# Patient Record
Sex: Male | Born: 1955 | Race: White | Hispanic: No | Marital: Married | Smoking: Former smoker
Health system: Southern US, Community
[De-identification: ages and names within clinical notes are randomized; demographics above are authoritative.]

## PROBLEM LIST (undated history)

## (undated) DIAGNOSIS — G459 Transient cerebral ischemic attack, unspecified: Secondary | ICD-10-CM

## (undated) DIAGNOSIS — E876 Hypokalemia: Secondary | ICD-10-CM

## (undated) DIAGNOSIS — F419 Anxiety disorder, unspecified: Secondary | ICD-10-CM

## (undated) DIAGNOSIS — R918 Other nonspecific abnormal finding of lung field: Secondary | ICD-10-CM

## (undated) DIAGNOSIS — I10 Essential (primary) hypertension: Secondary | ICD-10-CM

## (undated) DIAGNOSIS — K219 Gastro-esophageal reflux disease without esophagitis: Secondary | ICD-10-CM

## (undated) DIAGNOSIS — E7800 Pure hypercholesterolemia, unspecified: Secondary | ICD-10-CM

## (undated) HISTORY — DX: Essential (primary) hypertension: I10

## (undated) HISTORY — DX: Transient cerebral ischemic attack, unspecified: G45.9

## (undated) HISTORY — DX: Other nonspecific abnormal finding of lung field: R91.8

## (undated) HISTORY — PX: NO PAST SURGERIES: SHX2092

## (undated) HISTORY — DX: Hypokalemia: E87.6

## (undated) HISTORY — DX: Anxiety disorder, unspecified: F41.9

---

## 2009-12-04 ENCOUNTER — Encounter: Payer: Self-pay | Admitting: Cardiovascular Disease

## 2009-12-04 ENCOUNTER — Encounter: Payer: Self-pay | Admitting: Gastroenterology

## 2009-12-12 ENCOUNTER — Encounter: Payer: Self-pay | Admitting: Cardiovascular Disease

## 2010-01-01 ENCOUNTER — Ambulatory Visit: Payer: Self-pay | Admitting: Gastroenterology

## 2010-01-07 DIAGNOSIS — F411 Generalized anxiety disorder: Secondary | ICD-10-CM | POA: Insufficient documentation

## 2010-01-07 DIAGNOSIS — E785 Hyperlipidemia, unspecified: Secondary | ICD-10-CM

## 2010-01-07 DIAGNOSIS — I10 Essential (primary) hypertension: Secondary | ICD-10-CM | POA: Insufficient documentation

## 2010-01-07 DIAGNOSIS — R51 Headache: Secondary | ICD-10-CM | POA: Insufficient documentation

## 2010-01-07 DIAGNOSIS — R519 Headache, unspecified: Secondary | ICD-10-CM | POA: Insufficient documentation

## 2010-01-08 ENCOUNTER — Encounter: Payer: Self-pay | Admitting: Cardiovascular Disease

## 2010-01-09 ENCOUNTER — Ambulatory Visit: Payer: Self-pay | Admitting: Cardiovascular Disease

## 2010-01-09 DIAGNOSIS — R0602 Shortness of breath: Secondary | ICD-10-CM

## 2011-01-22 NOTE — Letter (Signed)
Summary: [facility] Office Note  [facility] Office Note   Imported By: Roderic Ovens 01/21/2010 11:12:37  _____________________________________________________________________  External Attachment:    Type:   Image     Comment:   External Document

## 2011-01-22 NOTE — Assessment & Plan Note (Signed)
Summary: np3/htn/jml   Referring Provider:  Edd Fabian, MD  Primary Provider:  Lelan Pons, [facility]    History of Present Illness: Marvin Bender is seen today at the request of [facility].  He has significant anxiety disorder and is seeing psych.  This has been going on for about 2 years.  He has reactive HTN and is on a calcium blocker and ACE.  He describes a 40lb weight gain over the last year.  He quit smoking about 2 years ago.  He has dyspnea that seems functional.  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  There is no history of cardiac problems.  There is no history of ETOH abuts or chronic liver or kidney disease.  I reviewed lab work from [facility] and chemistries, LFTS and testosterone were normal.  I dd not see a thyroid.  His LDL was 136.  I told [name] I did not think he had a significant heart problem.  He can stop his amlodipine as it can cause weight gain and his BP is not that bad.  He will double his lisinipril in leiu of this.  He does not need any cardiac testing.    Current Problems (verified): 1)  Hyperlipidemia  (ICD-272.4) 2)  Hypertension  (ICD-401.9) 3)  Anxiety  (ICD-300.00) 4)  Screening Colorectal-cancer  (ICD-V76.51) 5)  Headache, Chronic  (ICD-784.0)  Current Medications (verified): 1)  Lisinopril 10 Mg Tabs (Lisinopril) .... One Tablet By Mouth Once Daily 2)  Clonazepam 0.5 Mg Tabs (Clonazepam) .... One Tablet By Mouth Once Daily  Allergies (verified): No Known Drug Allergies  Past History:  Past Medical History: Last updated: 01/07/2010 Current Problems:  HYPERLIPIDEMIA (ICD-272.4) HYPERTENSION (ICD-401.9) ANXIETY (ICD-300.00) SCREENING COLORECTAL-CANCER (ICD-V76.51) HEADACHE, CHRONIC (ICD-784.0)  Past Surgical History: Last updated: 01/01/2010 Unremarkable  Review of Systems       Denies fever, malais, weight loss, blurry vision, decreased visual acuity, cough, sputum, hemoptysis, pleuritic  pain, palpitaitons, heartburn, abdominal pain, melena, lower extremity edema, claudication, or rash.   Vital Signs:  Patient profile:   55 year old male Height:      73 inches Weight:      263 pounds BMI:     34.82 Pulse rate:   80 / minute BP sitting:   128 / 84  (left arm) Cuff size:   large  Vitals Entered By: Hardin Negus, RMA (January 09, 2010 9:22 AM)  Physical Exam  General:  Affect appropriate Healthy:  appears stated age HEENT: normal Neck supple with no adenopathy JVP normal no bruits no thyromegaly Lungs clear with no wheezing and good diaphragmatic motion Heart:  S1/S2 no murmur,rub, gallop or click PMI normal Abdomen: benighn, BS positve, no tenderness, no AAA no bruit.  No HSM or HJR Distal pulses intact with no bruits No edema Neuro non-focal Skin warm and dry    Impression & Recommendations:  Problem # 1:  HYPERLIPIDEMIA (ICD-272.4) Consider diet Rx.  No vascular disease.  Target LDL less than 130  Problem # 2:  HYPERTENSION (ICD-401.9) Stop amlodipine and increase ACE.  Low sodium diet.  Consder beta blocker to blunt somatic effects of anxiety if BP stays elevated on max dose of Lisinopril in future (40mg ) The following medications were removed from the medication list:    Amlodipine Besylate 5 Mg Tabs (Amlodipine besylate) ..... One tablet by mouth once daily His updated medication list for this problem includes:    Lisinopril 10 Mg Tabs (Lisinopril) .Marland KitchenMarland KitchenMarland KitchenMarland Kitchen  One tablet by mouth once daily  Problem # 3:  DYSPNEA (ICD-786.05) Functional secondary to weight gain and anxiety.  Normal cardiac exam and ECG The following medications were removed from the medication list:    Amlodipine Besylate 5 Mg Tabs (Amlodipine besylate) ..... One tablet by mouth once daily His updated medication list for this problem includes:    Lisinopril 10 Mg Tabs (Lisinopril) ..... One tablet by mouth once daily  Patient Instructions: 1)  Your physician has recommended you  make the following change in your medication:  2)  Stop Amlodipine 3)  Take Lisinopril 10mg  one whole tablet daily 4)  Your physician recommends that you schedule a follow-up appointment as needed with Dr Charlton Haws     EKG Report  Procedure date:  01/09/2010

## 2011-01-22 NOTE — Assessment & Plan Note (Signed)
Summary: abd swelling..em   History of Present Illness Visit Type: consult  Primary GI MD: Elie Goody MD [facility] Primary Provider: Lelan Pons, [facility]  Requesting Provider: Lelan Pons, [facility]  Chief Complaint: Bloating, weight gain, and hemorrhoids  History of Present Illness:   This is a 55 year old white male here today with his wife. He relates a weight gain of 28 pounds over the past 12 months, which occurred about 12 months after he stopped cigarette smoking. He notes a bloating feeling in his abdomen, but does not report any significant gas, flatus or belching. His wife was diagnosed with colon cancer at age 69. Patient has not previously had colonoscopy. He relates hemorrhoidal swelling in the past, but has not recently had problems with his hemorrhoids. He denies rectal bleeding and rectal pain.   GI Review of Systems    Reports bloating and  weight gain.      Denies abdominal pain, acid reflux, belching, chest pain, dysphagia with liquids, dysphagia with solids, heartburn, loss of appetite, nausea, vomiting, vomiting blood, and  weight loss.      Reports hemorrhoids.     Denies anal fissure, black tarry stools, change in bowel habit, constipation, diarrhea, diverticulosis, fecal incontinence, heme positive stool, irritable bowel syndrome, jaundice, light color stool, liver problems, rectal bleeding, and  rectal pain.   Current Medications (verified): 1)  Amlodipine Besylate 5 Mg Tabs (Amlodipine Besylate) .... One Tablet By Mouth Once Daily 2)  Lisinopril 10 Mg Tabs (Lisinopril) .... One Tablet By Mouth Once Daily 3)  Clonazepam 0.5 Mg Tabs (Clonazepam) .... 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  Smoking Status:  quit  Review of Systems       The patient complains of allergy/sinus, anxiety-new, back pain, fatigue, headaches-new, and hearing problems.         The pertinent positives and negatives are noted as above and in the HPI. All other ROS were reviewed and were negative.   Vital Signs:  Patient profile:   54 year old male Height:      73 inches Weight:      265 pounds BMI:     35.09 BSA:     2.43 Pulse rate:   76 / minute Pulse rhythm:   regular BP sitting:   132 / 86  (left arm) Cuff size:   regular  Vitals Entered By: Ok Anis CMA (January 01, 2010 3:29 PM)  Physical Exam  General:  Well developed, well nourished, no acute distress. obese.   Head:  Normocephalic and atraumatic. Eyes:  PERRLA, no icterus. Ears:  Normal auditory acuity. Mouth:  No deformity or lesions, dentition normal. Neck:  Supple; no masses or thyromegaly. Lungs:  Clear throughout to auscultation. Heart:  Regular rate and rhythm; no murmurs, rubs,  or bruits. Abdomen:  Soft, nontender and nondistended. No masses, hepatosplenomegaly or hernias noted. Normal bowel sounds. Rectal:  deferred until time of colonoscopy.   Msk:  Symmetrical with no gross deformities. Normal posture. Pulses:  Normal pulses noted. Extremities:  No clubbing,  cyanosis, edema or deformities noted. Neurologic:  Alert and  oriented x4;  grossly normal neurologically. Cervical Nodes:  No significant cervical adenopathy. Inguinal Nodes:  No significant inguinal adenopathy. Psych:  Alert and cooperative. Normal mood and affect.  Impression & Recommendations:  Problem # 1:  SCREENING COLORECTAL-CANCER (ICD-V76.51) Average risk for colorectal cancer. His wife has had colon cancer and has undergone 3  colonoscopies so he understands the procedure and the reasons for it. He is reluctant to schedule but after some discussion he agreed to discuss it further with his wife. I attempted to answer all of his questions and address his concerns. I supplied written literature regarding colonoscopy and colon cancer screening. He states he will call back to schedule. The risks, benefits and alternatives to colonoscopy with possible biopsy and possible polypectomy were discussed with the patient and they consent to proceed. The procedure will be scheduled electively. His abdominal bloating is likely related to weight gain.  Patient Instructions: 1)  Please call us back and schedule a colonoscopy and your convenience.  2)  Colorectal Cancer Screening handout given.  3)  Copy sent to : Lelan Pons, PAC 4)  The medication list was reviewed and reconciled.  All changed / newly prescribed medications were explained.  A complete medication list was provided to the patient / caregiver.

## 2011-01-22 NOTE — Progress Notes (Signed)
Summary: [facility] Med List  [facility] Med List   Imported By: Roderic Ovens 01/21/2010 11:12:08  _____________________________________________________________________  External Attachment:    Type:   Image     Comment:   External Document

## 2011-01-23 NOTE — Letter (Signed)
Summary: [facility]  [employer]   Imported By: Sherian Rein 01/06/2010 10:10:47  _____________________________________________________________________  External Attachment:    Type:   Image     Comment:   External Document

## 2012-03-03 ENCOUNTER — Encounter: Payer: Self-pay | Admitting: Gastroenterology

## 2012-04-11 ENCOUNTER — Ambulatory Visit (AMBULATORY_SURGERY_CENTER): Payer: PRIVATE HEALTH INSURANCE | Admitting: *Deleted

## 2012-04-11 ENCOUNTER — Encounter: Payer: Self-pay | Admitting: Gastroenterology

## 2012-04-11 VITALS — Ht 73.0 in | Wt 265.9 lb

## 2012-04-11 DIAGNOSIS — Z1211 Encounter for screening for malignant neoplasm of colon: Secondary | ICD-10-CM

## 2012-04-11 MED ORDER — PEG-KCL-NACL-NASULF-NA ASC-C 100 G PO SOLR: ORAL | Status: DC

## 2012-04-11 NOTE — Progress Notes (Signed)
Pt did not brings medication to [facility].  Pt called to give current meds medications.  Instructed not to take Triamterene/HCTZ day of procedure. Marvin Bender

## 2012-04-19 ENCOUNTER — Encounter: Payer: Self-pay | Admitting: Gastroenterology

## 2012-05-18 ENCOUNTER — Encounter: Payer: Self-pay | Admitting: Gastroenterology

## 2012-05-18 ENCOUNTER — Ambulatory Visit (AMBULATORY_SURGERY_CENTER): Payer: PRIVATE HEALTH INSURANCE | Admitting: Gastroenterology

## 2012-05-18 VITALS — BP 131/105 | HR 87 | Temp 97.9°F | Resp 20 | Ht 73.0 in | Wt 265.0 lb

## 2012-05-18 DIAGNOSIS — Z1211 Encounter for screening for malignant neoplasm of colon: Secondary | ICD-10-CM

## 2012-05-18 MED ORDER — SODIUM CHLORIDE 0.9 % IV SOLN: 500.0000 mL | INTRAVENOUS | Status: DC

## 2012-05-18 NOTE — Patient Instructions (Signed)
YOU HAD AN ENDOSCOPIC PROCEDURE TODAY AT THE [facility]: Refer to the procedure report that was given to you for any specific questions about what was found during the examination.  If the procedure report does not answer your questions, please call your gastroenterologist to clarify.  If you requested that your care partner not be given the details of your procedure findings, then the procedure report has been included in a sealed envelope for you to review at your convenience later.  YOU SHOULD EXPECT: Some feelings of bloating in the abdomen. Passage of more gas than usual.  Walking can help get rid of the air that was put into your GI tract during the procedure and reduce the bloating. If you had a lower endoscopy (such as a colonoscopy or flexible sigmoidoscopy) you may notice spotting of blood in your stool or on the toilet paper. If you underwent a bowel prep for your procedure, then you may not have a normal bowel movement for a few days.  DIET: Your first meal following the procedure should be a light meal and then it is ok to progress to your normal diet.  A half-sandwich or bowl of soup is an example of a good first meal.  Heavy or fried foods are harder to digest and may make you feel nauseous or bloated.  Likewise meals heavy in dairy and vegetables can cause extra gas to form and this can also increase the bloating.  Drink plenty of fluids but you should avoid alcoholic beverages for 24 hours.  ACTIVITY: Your care partner should take you home directly after the procedure.  You should plan to take it easy, moving slowly for the rest of the day.  You can resume normal activity the day after the procedure however you should NOT DRIVE or use heavy machinery for 24 hours (because of the sedation medicines used during the test).    SYMPTOMS TO REPORT IMMEDIATELY: A gastroenterologist can be reached at any hour.  During normal business hours, 8:30 AM to 5:00 PM Monday through Friday,  call [phone].  After hours and on weekends, please call the GI answering service at [phone] who will take a message and have the physician on call contact you.   Following lower endoscopy (colonoscopy or flexible sigmoidoscopy):  Excessive amounts of blood in the stool  Significant tenderness or worsening of abdominal pains  Swelling of the abdomen that is new, acute  Fever of 100F or higher    FOLLOW UP: If any biopsies were taken you will be contacted by phone or by letter within the next 1-3 weeks.  Call your gastroenterologist if you have not heard about the biopsies in 3 weeks.  Our staff will call the home number listed on your records the next business day following your procedure to check on you and address any questions or concerns that you may have at that time regarding the information given to you following your procedure. This is a courtesy call and so if there is no answer at the home number and we have not heard from you through the emergency physician on call, we will assume that you have returned to your regular daily activities without incident.  SIGNATURES/CONFIDENTIALITY: You and/or your care partner have signed paperwork which will be entered into your electronic medical record.  These signatures attest to the fact that that the information above on your After Visit Summary has been reviewed and is understood.  Full responsibility of the confidentiality   of this discharge information lies with you and/or your care-partner.     

## 2012-05-18 NOTE — Op Note (Signed)
[facility] [address] [place], [place]  [address]  COLONOSCOPY PROCEDURE REPORT  PATIENT:  Marvin Bender, Marvin Bender  MR#:  [id number] BIRTHDATE:  1956-01-27, 55 yrs. old  GENDER:  male ENDOSCOPIST:  Judie Petit T. Russella Dar, MD, [facility]  PROCEDURE DATE:  05/18/2012 PROCEDURE:  Colonoscopy [address]9629 ASA CLASS:  Class II INDICATIONS:  1) Routine Risk Screening MEDICATIONS:   MAC sedation, administered by CRNA, propofol (Diprivan) 200 mg IV DESCRIPTION OF PROCEDURE:   After the risks benefits and alternatives of the procedure were thoroughly explained, informed consent was obtained.  Digital rectal exam was performed and revealed no abnormalities.   The LB CF-H180AL [id number] endoscope was introduced through the anus and advanced to the cecum, which was identified by both the appendix and ileocecal valve, without limitations.  The quality of the prep was excellent, using MoviPrep.  The instrument was then slowly withdrawn as the colon was fully examined. <<PROCEDUREIMAGES>> FINDINGS:  A normal appearing cecum, ileocecal valve, and appendiceal orifice were identified. The ascending, hepatic flexure, transverse, splenic flexure, descending, sigmoid colon, and rectum appeared unremarkable.   Retroflexed views in the rectum revealed no abnormalities.   The time to cecum =  1 minutes. The scope was then withdrawn (time =  8.25  min) from the patient and the procedure completed.  COMPLICATIONS:  None  ENDOSCOPIC IMPRESSION: 1) Normal colon  RECOMMENDATIONS: 1) Continue current colorectal screening recommendations for "routine risk" patients with a repeat colonoscopy in 10 years.  Venita Lick. Russella Dar, MD, [facility]  n. eSIGNED:   Venita Lick. Antionio Negron at 05/18/2012 09:07 AM  Lupa, Harvie Heck, [phone]

## 2012-05-18 NOTE — Progress Notes (Signed)
Patient did not have preoperative order for IV antibiotic SSI prophylaxis. (G8918)  Patient did not experience any of the following events: a burn prior to discharge; a fall within the facility; wrong site/side/patient/procedure/implant event; or a hospital transfer or hospital admission upon discharge from the facility. (G8907)  

## 2012-05-19 ENCOUNTER — Telehealth: Payer: Self-pay | Admitting: *Deleted

## 2012-05-19 NOTE — Telephone Encounter (Signed)
  Follow up Call-  Call back number 05/18/2012  Post procedure Call Back phone  # [phone]  Permission to leave phone message Yes     Patient questions:  Do you have a fever, pain , or abdominal swelling? no Pain Score  0 *  Have you tolerated food without any problems? yes  Have you been able to return to your normal activities? yes  Do you have any questions about your discharge instructions: Diet   no Medications  no Follow up visit  no  Do you have questions or concerns about your Care? no  Actions: * If pain score is 4 or above: No action needed, pain <4.  Information provided by wife.

## 2012-05-20 ENCOUNTER — Encounter: Payer: Self-pay | Admitting: Gastroenterology

## 2013-09-15 LAB — PULMONARY FUNCTION TEST

## 2013-09-28 ENCOUNTER — Ambulatory Visit (INDEPENDENT_AMBULATORY_CARE_PROVIDER_SITE_OTHER): Payer: Managed Care, Other (non HMO) | Admitting: Internal Medicine

## 2013-09-28 ENCOUNTER — Encounter: Payer: Self-pay | Admitting: Internal Medicine

## 2013-09-28 VITALS — BP 138/92 | HR 82 | Temp 97.9°F | Ht 73.0 in | Wt 274.8 lb

## 2013-09-28 DIAGNOSIS — R918 Other nonspecific abnormal finding of lung field: Secondary | ICD-10-CM

## 2013-09-28 DIAGNOSIS — R0602 Shortness of breath: Secondary | ICD-10-CM

## 2013-09-28 NOTE — Patient Instructions (Addendum)
GERD (REFLUX)  is an extremely common cause of respiratory symptoms, many times with no significant heartburn at all.    It can be treated with medication, but also with lifestyle changes including avoidance of late meals, excessive alcohol, smoking cessation, and avoid fatty foods, chocolate, peppermint, colas, red wine, and acidic juices such as orange juice.  NO MINT OR MENTHOL PRODUCTS SO NO COUGH DROPS  USE SUGARLESS CANDY INSTEAD (jolley ranchers or Stover's)  NO OIL BASED VITAMINS - use powdered substitutes.   Try prilosec 20mg   Take 30-60 min before first meal of the day and Pepcid 20 mg one bedtime until return  Please schedule a follow up office visit in 4 weeks, sooner if needed with previous lung function tests from [facility]

## 2013-09-28 NOTE — Progress Notes (Signed)
  Subjective:    Patient ID: Marvin Bender, male    DOB: 11/01/1956    MRN: [id number]  HPI  89 yowm quit smoking 2008 with no problems at that point and wt 225 with onset of doe x 2012  eval with CT MPN self referred to pulmonary clinic 09/28/2013 for second opinion after intial w/ at [place].  09/28/2013 1st [facility] office visit/ Caton Popowski cc indolent onset doe progressive to point where oob x 100 ft x 2 years with neg cardiac w/u then.  No assoc cough. No prev response to inhalers  No obvious day to day or daytime variabilty or assoc  or cp or chest tightness, subjective wheeze overt sinus  symptoms. No unusual exp hx or h/o childhood pna/ asthma or knowledge of premature birth.  Sleeping ok without nocturnal  or early am exacerbation  of respiratory  c/o's or need for noct saba. Also denies any obvious fluctuation of symptoms with weather or environmental changes or other aggravating or alleviating factors except as outlined above   Current Medications, Allergies, Complete Past Medical History, Past Surgical History, Family History, and Social History were reviewed in [employer] record.  ROS  The following are not active complaints unless bolded sore throat, dysphagia, dental problems, itching, sneezing,  nasal congestion or excess/ purulent secretions, ear ache,   fever, chills, sweats, unintended wt loss, pleuritic or exertional cp, hemoptysis,  orthopnea pnd or leg swelling, presyncope, palpitations, heartburn, abdominal pain, anorexia, nausea, vomiting, diarrhea  or change in bowel or urinary habits, change in stools or urine, dysuria,hematuria,  rash, arthralgias, visual complaints, headache, numbness weakness or ataxia or problems with walking or coordination,  change in mood/affect or memory.       Review of Systems  Constitutional: Negative for fever, chills, activity change, appetite change and unexpected weight change.  HENT: Negative for congestion,  dental problem, postnasal drip, rhinorrhea, sneezing, sore throat, trouble swallowing and voice change.   Eyes: Negative for visual disturbance.  Respiratory: Positive for shortness of breath. Negative for cough and choking.   Cardiovascular: Negative for chest pain and leg swelling.  Gastrointestinal: Negative for nausea, vomiting and abdominal pain.  Genitourinary: Negative for difficulty urinating.       Heartburn Indigestion  Musculoskeletal: Negative for arthralgias.  Skin: Negative for rash.  Psychiatric/Behavioral: Negative for behavioral problems and confusion.       Objective:   Physical Exam  Wt Readings from Last 3 Encounters:  09/28/13 214 lb (97.07 kg)  05/18/12 265 lb (120.203 kg)  04/11/12 265 lb 14.4 oz (120.611 kg)      HEENT: nl dentition, turbinates, and orophanx. Nl external ear canals without cough reflex   NECK :  without JVD/Nodes/TM/ nl carotid upstrokes bilaterally   LUNGS: no acc muscle use, clear to A and P bilaterally without cough on insp or exp maneuvers   CV:  RRR  no s3 or murmur or increase in P2, no edema   ABD:  Obese / soft and nontender with nl excursion in the supine position. No bruits or organomegaly, bowel sounds nl  MS:  warm without deformities, calf tenderness, cyanosis or clubbing  SKIN: warm and dry without lesions    NEURO:  alert, approp, no deficits    CT Chest 09/15/13  bilat lung nodules up to 7.5 mm on R          Assessment & Plan:

## 2013-09-30 ENCOUNTER — Encounter: Payer: Self-pay | Admitting: Internal Medicine

## 2013-09-30 DIAGNOSIS — R918 Other nonspecific abnormal finding of lung field: Secondary | ICD-10-CM | POA: Insufficient documentation

## 2013-09-30 NOTE — Assessment & Plan Note (Signed)
-   09/28/2013  Walked RA x 3 laps @ 185 ft each stopped due to end of study sats still 88%   When respiratory symptoms begin or become refractory well after a patient reports complete smoking cessation,  Especially when this wasn't the case while they were smoking, a red flag is raised based on the work of Dr Primitivo Gauze which states:  if you quit smoking when your best day FEV1 is still well preserved it is highly unlikely you will progress to severe disease.  That is to say, once the smoking stops,  the symptoms should not suddenly erupt or markedly worsen.  If so, the differential diagnosis should include  obesity/deconditioning,  LPR/Reflux/Aspiration syndromes,  occult CHF, or  especially side effect of medications commonly used in this population.   In this case obesity and assoc GERD/lpr the likely cause of his symptoms > rec gerd rx then regroup with pfts from [facility] when available.

## 2013-09-30 NOTE — Assessment & Plan Note (Signed)
[employer] guidelines reviewed, agree with limited ct in 6 months for just the 7.5 mm nodule - the others can be followed in one year with full Chest ct if the larger one is stable p 6 months.

## 2013-10-26 ENCOUNTER — Encounter: Payer: Self-pay | Admitting: Internal Medicine

## 2013-10-26 ENCOUNTER — Ambulatory Visit (INDEPENDENT_AMBULATORY_CARE_PROVIDER_SITE_OTHER): Payer: Managed Care, Other (non HMO) | Admitting: Internal Medicine

## 2013-10-26 VITALS — BP 116/80 | HR 80 | Temp 98.0°F | Ht 73.0 in | Wt 276.0 lb

## 2013-10-26 DIAGNOSIS — R0602 Shortness of breath: Secondary | ICD-10-CM

## 2013-10-26 NOTE — Assessment & Plan Note (Addendum)
-   09/28/2013  Walked RA x 3 laps @ 185 ft each stopped due to end of study sats still 88%  Doubt this level of desat correlates with his ex limitation so reasonable to do cpst on 02 if needed to sort out mechanism of sob.  The sense of sob that occurs bending over is clearly more related to obesity

## 2013-10-26 NOTE — Patient Instructions (Addendum)
Please see patient coordinator before you leave today  to schedule CPST with before and after spirometry   Don't stop the prilosec or pepcid until after the CPST

## 2013-10-26 NOTE — Progress Notes (Signed)
Subjective:    Patient ID: Marvin Bender, male    DOB: November 17, 1956    MRN: [id number]   Brief patient profile:  28 yowm quit smoking 2008 with no problems at that point and wt 225 with onset of doe x 2012  eval with CT MPN self [facility] to [facility] 09/28/2013 for second opinion after intial w/ at [place].   History of Present Illness  09/28/2013 1st [facility] office visit/ Marvin [facility] cc indolent onset doe progressive to point where oob x 100 ft x 2 years with neg cardiac w/u then.  No assoc cough. No prev response to inhalers. rec GERD (REFLUX)  is an extremely common cause of respiratory symptoms, many times with no significant heartburn at all.  Try prilosec 20mg   Take 30-60 min before first meal of the day and Pepcid 20 mg one bedtime until return Please schedule a follow up office visit in 4 weeks, sooner if needed with previous lung function tests from [facility]  10/26/2013 f/u ov/Marvin [facility]ender re:  Unexplained sob x 2 years/ did not bring records though has them at home Chief Complaint  Patient presents with  . Follow-up    Pt states that his breathing is unchanged since his last visit.  doe bending over picking up leaves or tying shoes more than walking   No obvious day to day or daytime variabilty or assoc  or cp or chest tightness, subjective wheeze overt sinus  symptoms. No unusual exp hx or h/o childhood pna/ asthma or knowledge of premature birth.  Sleeping ok without nocturnal  or early am exacerbation  of respiratory  c/o's or need for noct saba. Also denies any obvious fluctuation of symptoms with weather or environmental changes or other aggravating or alleviating factors except as outlined above   Current Medications, Allergies, Complete Past Medical History, Past Surgical History, Family History, and Social History were reviewed in [employer] record.  ROS  The following are not active complaints unless bolded sore throat, dysphagia, dental problems,  itching, sneezing,  nasal congestion or excess/ purulent secretions, ear ache,   fever, chills, sweats, unintended wt loss, pleuritic or exertional cp, hemoptysis,  orthopnea pnd or leg swelling, presyncope, palpitations, heartburn, abdominal pain, anorexia, nausea, vomiting, diarrhea  or change in bowel or urinary habits, change in stools or urine, dysuria,hematuria,  rash, arthralgias, visual complaints, headache, numbness weakness or ataxia or problems with walking or coordination,  change in mood/affect or memory.            Objective:   Physical Exam  10/26/2013       276  Wt Readings from Last 3 Encounters:  09/28/13 214 lb (97.07 kg)  05/18/12 265 lb (120.203 kg)  04/11/12 265 lb 14.4 oz (120.611 kg)      HEENT: nl dentition, turbinates, and orophanx. Nl external ear canals without cough reflex   NECK :  without JVD/Nodes/TM/ nl carotid upstrokes bilaterally   LUNGS: no acc muscle use, clear to A and P bilaterally without cough on insp or exp maneuvers   CV:  RRR  no s3 or murmur or increase in P2, no edema   ABD:  Obese / soft and nontender with nl excursion in the supine position. No bruits or organomegaly, bowel sounds nl  MS:  warm without deformities, calf tenderness, cyanosis or clubbing  SKIN: warm and dry without lesions    NEURO:  alert, approp, no deficits     CT Chest 09/15/13 non contrasted bilat  lung nodules up to 7.5 mm on R          Assessment & Plan:

## 2013-10-27 ENCOUNTER — Encounter: Payer: Self-pay | Admitting: Internal Medicine

## 2013-10-28 ENCOUNTER — Encounter: Payer: Self-pay | Admitting: Internal Medicine

## 2013-11-01 ENCOUNTER — Encounter (HOSPITAL_COMMUNITY): Payer: Managed Care, Other (non HMO) | Attending: Internal Medicine

## 2013-11-01 DIAGNOSIS — R0602 Shortness of breath: Secondary | ICD-10-CM | POA: Insufficient documentation

## 2013-11-01 DIAGNOSIS — R079 Chest pain, unspecified: Secondary | ICD-10-CM | POA: Insufficient documentation

## 2013-11-01 DIAGNOSIS — Z01811 Encounter for preprocedural respiratory examination: Secondary | ICD-10-CM | POA: Insufficient documentation

## 2013-11-08 ENCOUNTER — Telehealth: Payer: Self-pay | Admitting: Internal Medicine

## 2013-11-08 NOTE — Telephone Encounter (Signed)
Pt had CPST 11/01/13. Prelim is in EPIC. Please advise Dr. Sherene Sires thanks

## 2013-11-09 ENCOUNTER — Encounter: Payer: Self-pay | Admitting: Internal Medicine

## 2013-11-09 NOTE — Telephone Encounter (Signed)
Study showed nl aerobic function/ capacity - would be happy to review in detail in the context of an ov to regroup re best way to regain normal ex tolerance but there is no physiologic limitation identified

## 2013-11-09 NOTE — Telephone Encounter (Signed)
Pt aware of recs.  Nothing further needed. 

## 2013-11-12 DIAGNOSIS — R0602 Shortness of breath: Secondary | ICD-10-CM

## 2013-11-13 ENCOUNTER — Telehealth: Payer: Self-pay | Admitting: Internal Medicine

## 2013-11-13 NOTE — Progress Notes (Signed)
Quick Note:  LMTCB ______ 

## 2013-11-13 NOTE — Telephone Encounter (Signed)
Call patient : Study is unremarkable, no change in recs - needs ov to regroup if still having problems to review study in detail and come up with longterm plan      Spoke with pt and notified of results per Dr. Sherene Sires. Pt verbalized understanding.  Pt states that he is doing well and does not feel like f/u is needed He is asking if he needs to continue on acid suppression meds If so, if aciphex okay for him to take? Or continue prilosec? Please advise, thanks

## 2013-11-13 NOTE — Telephone Encounter (Signed)
Ok to continue if he feels they are helping until next ov  If not convinced helping then d/c aciphex = prilosec - his choice  Note Due back here anyway 02/2014 re nodules so we can regroup then re ppi then

## 2013-11-13 NOTE — Progress Notes (Signed)
Quick Note:  Spoke with pt and notified of results per Dr. Wert. Pt verbalized understanding and denied any questions.  ______ 

## 2013-11-13 NOTE — Telephone Encounter (Signed)
LMTCB

## 2013-11-14 ENCOUNTER — Telehealth: Payer: Self-pay | Admitting: Internal Medicine

## 2013-11-14 NOTE — Telephone Encounter (Signed)
Pt advised. Sonita Michiels, CMA  

## 2013-11-14 NOTE — Telephone Encounter (Signed)
Called and spoke with spouse. He was not in. Advised me to call cell. I called cell LMTCB x1

## 2013-11-15 MED ORDER — RABEPRAZOLE SODIUM 20 MG PO TBEC: 20.0000 mg | DELAYED_RELEASE_TABLET | Freq: Every day | ORAL | Status: DC

## 2013-11-15 NOTE — Telephone Encounter (Signed)
Per last phone note: Ok to continue if he feels they are helping until next ov  If not convinced helping then d/c  aciphex = prilosec - his choice  Pt is asking for an rx for aciphex be sent to [facility] in [place]. Rx sent. Carron Curie, CMA

## 2014-01-20 ENCOUNTER — Ambulatory Visit (INDEPENDENT_AMBULATORY_CARE_PROVIDER_SITE_OTHER): Payer: Managed Care, Other (non HMO) | Admitting: Nurse Practitioner

## 2014-01-20 ENCOUNTER — Encounter: Payer: Self-pay | Admitting: Nurse Practitioner

## 2014-01-20 VITALS — BP 112/85 | HR 100 | Temp 97.4°F | Ht 73.0 in | Wt 272.0 lb

## 2014-01-20 DIAGNOSIS — L239 Allergic contact dermatitis, unspecified cause: Secondary | ICD-10-CM

## 2014-01-20 DIAGNOSIS — L259 Unspecified contact dermatitis, unspecified cause: Secondary | ICD-10-CM

## 2014-01-20 MED ORDER — METHYLPREDNISOLONE ACETATE 80 MG/ML IJ SUSP
80.0000 mg | Freq: Once | INTRAMUSCULAR | Status: AC
  Administered 2014-01-20: 80 mg via INTRAMUSCULAR

## 2014-01-20 NOTE — Progress Notes (Signed)
   Subjective:    Patient ID: Pamella Pertandy A Pulis, male    DOB: 1956/07/14, 58 y.o.   MRN: [id number]  HPI  Patient in c/o rash - woke up with it this morning- appears to bbe on bil forearms and wrist- is spreading slowly- Very itchy- denies exposure to any new foods ,lotions or soaps. 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    Review of Systems  Constitutional: Negative.   HENT: Negative.   Respiratory: Negative.   Cardiovascular: Negative.   Musculoskeletal: Negative.   Neurological: Negative.   All other systems reviewed and are negative.       Objective:   Physical Exam  Constitutional: He appears well-developed and well-nourished.  Cardiovascular: Normal rate, regular rhythm and normal heart sounds.   Pulmonary/Chest: Effort normal and breath sounds normal.  Skin: Skin is warm. Rash noted.  Fine erythematous maculo-papular rash on bil flank and forearms.  Psychiatric: He has a normal mood and affect. His behavior is normal. Judgment and thought content normal.   BP 112/85  Pulse 100  Temp(Src) 97.4 F (36.3 C) (Oral)  Ht 6\' 1"  (1.854 m)  Wt 272 lb (123.378 kg)  BMI 35.89 kg/m2        Assessment & Plan:   1. Allergic dermatitis    Meds ordered this encounter  Medications  . rosuvastatin (CRESTOR) 10 MG tablet    Sig: Take 10 mg by mouth daily.  . methylPREDNISolone acetate (DEPO-MEDROL) injection 80 mg    Sig:    Avoid scratching Cool compresses Benadryl OTC for itching RTO prn  Mary-Margaret Daphine DeutscherMartin, FNP

## 2014-01-20 NOTE — Patient Instructions (Signed)
Contact Dermatitis Contact dermatitis is a rash that happens when something touches the skin. You touched something that irritates your skin, or you have allergies to something you touched. HOME CARE   Avoid the thing that caused your rash.  Keep your rash away from hot water, soap, sunlight, chemicals, and other things that might bother it.  Do not scratch your rash.  You can take cool baths to help stop itching.  Only take medicine as told by your doctor.  Keep all doctor visits as told. GET HELP RIGHT AWAY IF:   Your rash is not better after 3 days.  Your rash gets worse.  Your rash is puffy (swollen), tender, red, sore, or warm.  You have problems with your medicine. MAKE SURE YOU:   Understand these instructions.  Will watch your condition.  Will get help right away if you are not doing well or get worse. Document Released: 10/04/2009 Document Revised: 02/29/2012 Document Reviewed: 05/12/2011 ExitCare Patient Information 2014 [employer].  

## 2014-03-15 ENCOUNTER — Telehealth: Payer: Self-pay | Admitting: *Deleted

## 2014-03-15 DIAGNOSIS — R918 Other nonspecific abnormal finding of lung field: Secondary | ICD-10-CM

## 2014-03-15 NOTE — Telephone Encounter (Signed)
Message copied by Christen ButterASKIN, Kaiana Marion M on Thu Mar 15, 2014 12:37 PM ------      Message from: Nyoka CowdenWERT, MICHAEL B      Created: Sat Sep 30, 2013  8:38 AM       [facility] limited [facility] re 7.5 mm R nodule ------

## 2014-03-15 NOTE — Telephone Encounter (Signed)
LMTCB for the pt 

## 2014-03-19 NOTE — Telephone Encounter (Signed)
LMTCB

## 2014-03-20 NOTE — Telephone Encounter (Signed)
Spoke with pt and notified of ct chest needed  He verbalized understanding  [facility]

## 2014-03-30 ENCOUNTER — Ambulatory Visit (INDEPENDENT_AMBULATORY_CARE_PROVIDER_SITE_OTHER)
Admission: RE | Admit: 2014-03-30 | Discharge: 2014-03-30 | Disposition: A | Discharge status: Home / Self Care | Payer: Managed Care, Other (non HMO) | Source: Ambulatory Visit | Attending: Internal Medicine | Admitting: Internal Medicine

## 2014-03-30 DIAGNOSIS — R918 Other nonspecific abnormal finding of lung field: Secondary | ICD-10-CM

## 2014-03-31 ENCOUNTER — Encounter: Payer: Self-pay | Admitting: Internal Medicine

## 2014-04-02 ENCOUNTER — Telehealth: Payer: Self-pay | Admitting: Internal Medicine

## 2014-04-02 NOTE — Progress Notes (Signed)
Quick Note:  Spoke with pt and notified of results per Dr. Wert. Pt verbalized understanding and denied any questions.  ______ 

## 2014-04-02 NOTE — Telephone Encounter (Signed)
Pt is aware of CT results. Marvin Bender had already called him about this and reviewed his results. Nothing further was needed.

## 2014-04-11 ENCOUNTER — Ambulatory Visit (INDEPENDENT_AMBULATORY_CARE_PROVIDER_SITE_OTHER): Payer: Managed Care, Other (non HMO)

## 2014-04-11 ENCOUNTER — Ambulatory Visit (INDEPENDENT_AMBULATORY_CARE_PROVIDER_SITE_OTHER): Payer: Managed Care, Other (non HMO) | Admitting: General Practice

## 2014-04-11 ENCOUNTER — Encounter: Payer: Self-pay | Admitting: General Practice

## 2014-04-11 ENCOUNTER — Telehealth: Payer: Self-pay | Admitting: Nurse Practitioner

## 2014-04-11 VITALS — BP 106/85 | HR 96 | Temp 96.8°F | Ht 73.0 in | Wt 277.6 lb

## 2014-04-11 DIAGNOSIS — K3189 Other diseases of stomach and duodenum: Secondary | ICD-10-CM

## 2014-04-11 DIAGNOSIS — R1013 Epigastric pain: Secondary | ICD-10-CM

## 2014-04-11 DIAGNOSIS — R109 Unspecified abdominal pain: Secondary | ICD-10-CM

## 2014-04-11 LAB — POCT UA - MICROSCOPIC ONLY
Casts, Ur, LPF, POC: NEGATIVE
Crystals, Ur, HPF, POC: NEGATIVE
MUCUS UA: NEGATIVE
YEAST UA: NEGATIVE

## 2014-04-11 LAB — POCT URINALYSIS DIPSTICK
BILIRUBIN UA: NEGATIVE
Glucose, UA: NEGATIVE
KETONES UA: NEGATIVE
LEUKOCYTES UA: NEGATIVE
Nitrite, UA: NEGATIVE
SPEC GRAV UA: 1.02
Urobilinogen, UA: NEGATIVE
pH, UA: 6

## 2014-04-11 LAB — POCT CBC
GRANULOCYTE PERCENT: 63.7 % (ref 37–80)
HEMATOCRIT: 48 % (ref 43.5–53.7)
HEMOGLOBIN: 15.5 g/dL (ref 14.1–18.1)
Lymph, poc: 2.9 (ref 0.6–3.4)
MCH, POC: 27.4 pg (ref 27–31.2)
MCHC: 32.3 g/dL (ref 31.8–35.4)
MCV: 84.7 fL (ref 80–97)
MPV: 6.3 fL (ref 0–99.8)
POC GRANULOCYTE: 6.4 (ref 2–6.9)
POC LYMPH PERCENT: 29 %L (ref 10–50)
Platelet Count, POC: 389 10*3/uL (ref 142–424)
RBC: 5.7 M/uL (ref 4.69–6.13)
RDW, POC: 13.6 %
WBC: 10 10*3/uL (ref 4.6–10.2)

## 2014-04-11 NOTE — Telephone Encounter (Signed)
No N/V/D. Appt scheduled. Patient aware.

## 2014-04-11 NOTE — Progress Notes (Deleted)
   Subjective:    Patient ID: Marvin Bender, male    DOB: 12/20/1956, 58 y.o.   MRN: [id number]  HPI     Review of Systems     Objective:   Physical Exam        Assessment & Plan:

## 2014-04-11 NOTE — Progress Notes (Signed)
Patient ID: Pamella Pertandy A Montoro, male   DOB: 01-24-1956, 58 y.o.   MRN: [id number]  Patient left exam room without being seen by a provider. Patient was called with results of test. He denies further abdominal pain and will follow up if symptoms return.   Results for orders placed in visit on 04/11/14  POCT CBC      Result Value Ref Range   WBC 10.0  4.6 - 10.2 K/uL   Lymph, poc 2.9  0.6 - 3.4   POC LYMPH PERCENT 29.0  10 - 50 %L   POC Granulocyte 6.4  2 - 6.9   Granulocyte percent 63.7  37 - 80 %G   RBC 5.7  4.69 - 6.13 M/uL   Hemoglobin 15.5  14.1 - 18.1 g/dL   HCT, POC 95.648.0  21.343.5 - 53.7 %   MCV 84.7  80 - 97 fL   MCH, POC 27.4  27 - 31.2 pg   MCHC 32.3  31.8 - 35.4 g/dL   RDW, POC 08.613.6     Platelet Count, POC 389.0  142 - 424 K/uL   MPV 6.3  0 - 99.8 fL  POCT URINALYSIS DIPSTICK      Result Value Ref Range   Color, UA gold     Clarity, UA clear     Glucose, UA negative     Bilirubin, UA negative     Ketones, UA negative     Spec Grav, UA 1.020     Blood, UA trace     pH, UA 6.0     Protein, UA trace     Urobilinogen, UA negative     Nitrite, UA negative     Leukocytes, UA Negative    POCT UA - MICROSCOPIC ONLY      Result Value Ref Range   WBC, Ur, HPF, POC rare     RBC, urine, microscopic occ     Bacteria, U Microscopic occ     Mucus, UA negative     Epithelial cells, urine per micros occ     Crystals, Ur, HPF, POC negative     Casts, Ur, LPF, POC negative     Yeast, UA negative     [facility] reading (PRIMARY) by Coralie KeensMae E. Sansa Alkema, FNP-C, no acute finding on abdominal xray.

## 2014-06-05 ENCOUNTER — Other Ambulatory Visit: Payer: Self-pay | Admitting: Internal Medicine

## 2014-07-04 ENCOUNTER — Other Ambulatory Visit: Payer: Self-pay | Admitting: Internal Medicine

## 2014-09-14 ENCOUNTER — Other Ambulatory Visit: Payer: Self-pay | Admitting: Internal Medicine

## 2014-09-14 MED ORDER — RABEPRAZOLE SODIUM 20 MG PO TBEC: DELAYED_RELEASE_TABLET | ORAL | Status: DC

## 2014-10-11 ENCOUNTER — Other Ambulatory Visit: Payer: Self-pay | Admitting: Internal Medicine

## 2014-11-08 ENCOUNTER — Other Ambulatory Visit: Payer: Self-pay | Admitting: Internal Medicine

## 2014-12-27 ENCOUNTER — Other Ambulatory Visit: Payer: Self-pay | Admitting: Internal Medicine

## 2015-01-24 ENCOUNTER — Other Ambulatory Visit: Payer: Self-pay | Admitting: Internal Medicine

## 2015-01-28 ENCOUNTER — Ambulatory Visit (INDEPENDENT_AMBULATORY_CARE_PROVIDER_SITE_OTHER): Payer: PRIVATE HEALTH INSURANCE

## 2015-01-28 ENCOUNTER — Encounter (INDEPENDENT_AMBULATORY_CARE_PROVIDER_SITE_OTHER): Payer: Self-pay

## 2015-01-28 ENCOUNTER — Ambulatory Visit (INDEPENDENT_AMBULATORY_CARE_PROVIDER_SITE_OTHER): Payer: PRIVATE HEALTH INSURANCE | Admitting: Family Medicine

## 2015-01-28 VITALS — BP 138/101 | HR 119 | Temp 98.6°F | Ht 73.0 in | Wt 275.5 lb

## 2015-01-28 DIAGNOSIS — M25512 Pain in left shoulder: Secondary | ICD-10-CM

## 2015-01-28 MED ORDER — HYDROCODONE-ACETAMINOPHEN 10-325 MG PO TABS: 1.0000 | ORAL_TABLET | Freq: Four times a day (QID) | ORAL | Status: DC | PRN

## 2015-01-28 MED ORDER — NAPROXEN 500 MG PO TABS: 500.0000 mg | ORAL_TABLET | Freq: Two times a day (BID) | ORAL | Status: DC

## 2015-01-28 NOTE — Progress Notes (Signed)
   Subjective:    Patient ID: Marvin Bender, male    DOB: 21-May-1956, 59 y.o.   MRN: [id number]  HPI Patient was lifting up his grandson when he heard a pop and developed severe left shoulder pain.  He is having difficulty raising his left arm above his head and is having severe shoulder pain.  Review of Systems  Constitutional: Negative for fever.  HENT: Negative for ear pain.   Eyes: Negative for discharge.  Respiratory: Negative for cough.   Cardiovascular: Negative for chest pain.  Gastrointestinal: Negative for abdominal distention.  Endocrine: Negative for polyuria.  Genitourinary: Negative for difficulty urinating.  Musculoskeletal: Positive for myalgias and arthralgias. Negative for gait problem and neck pain.  Skin: Negative for color change and rash.  Neurological: Negative for speech difficulty and headaches.  Psychiatric/Behavioral: Negative for agitation.       Objective:    BP 138/101 mmHg  Pulse 119  Temp(Src) 98.6 F (37 C) (Oral)  Ht 6\' 1"  (1.854 m)  Wt 275 lb 8 oz (124.966 kg)  BMI 36.36 kg/m2 Physical Exam  TTP left deltoid and decrease ROM with internal and external rotation.      Assessment & Plan:     ICD-9-CM ICD-10-CM   1. Left shoulder pain 719.41 M25.512 HYDROcodone-acetaminophen (NORCO) 10-325 MG per tablet     naproxen (NAPROSYN) 500 MG tablet     DG Shoulder Left     No Follow-up on file.  Deatra CanterWilliam J Oxford FNP

## 2015-02-20 ENCOUNTER — Other Ambulatory Visit: Payer: Self-pay | Admitting: Internal Medicine

## 2015-03-18 ENCOUNTER — Other Ambulatory Visit: Payer: Self-pay | Admitting: Radiology

## 2015-03-29 NOTE — Patient Instructions (Addendum)
Marvin Bender  03/29/2015   Your procedure is scheduled on:   04/04/2015  Report to [facility] at  615  AM.  Call this number if you have problems the morning of surgery: [phone]   Remember:   Do not eat food or drink liquids after midnight.   Take these medicines the morning of surgery with A SIP OF WATER: losartan, zantac, dyazide. Ativan if needed   Do not wear jewelry, make-up or nail polish.  Do not wear lotions, powders, or perfumes.   Do not shave 48 hours prior to surgery. Men may shave face and neck.  Do not bring valuables to the hospital.  [facility] is not responsible for any belongings or valuables.               Contacts, dentures or bridgework may not be worn into surgery.  Leave suitcase in the car. After surgery it may be brought to your room.  For patients admitted to the hospital, discharge time is determined by your treatment team.               Patients discharged the day of surgery will not be allowed to drive home.  Name and phone number of your driver: family  Special Instructions: Shower using CHG 2 nights before surgery and the night before surgery.  If you shower the day of surgery use CHG.  Use special wash - you have one bottle of CHG for all showers.  You should use approximately 1/3 of the bottle for each shower.   Please read over the following fact sheets that you were given: Pain Booklet, Coughing and Deep Breathing, Surgical Site Infection Prevention, Anesthesia Post-op Instructions and Care and Recovery After Surgery Meniscus Injury, Arthroscopy Arthroscopy is a surgical procedure that involves the use of a small scope that has a camera and surgical instruments on the end (arthroscope). An arthroscope can be used to repair your meniscus injury.  LET [facility] CARE PROVIDER KNOW ABOUT:  Any allergies you have.  All medicines you are taking, including vitamins, herbs, eyedrops, creams, and over-the-counter medicines.  Any recent  colds or infections you have had or currently have.  Previous problems you or members of your family have had with the use of anesthetics.  Any blood disorders or blood clotting problems you have.  Previous surgeries you have had.  Medical conditions you have. RISKS AND COMPLICATIONS Generally, this is a safe procedure. However, as with any procedure, problems can occur. Possible problems include:  Damage to nerves or blood vessels.  Excess bleeding.  Blood clots.  Infection. BEFORE THE PROCEDURE  Do not eat or drink for 6-8 hours before the procedure.  Take medicines as directed by your surgeon. Ask your surgeon about changing or stopping your regular medicines.  You may have lab tests the morning of surgery. PROCEDURE  You will be given one of the following:   A medicine that numbs the area (local anesthesia).  A medicine that makes you go to sleep (general anesthesia).  A medicine injected into your spine that numbs your body below the waist (spinal anesthesia). Most often, several small cuts (incisions) are made in the knee. The arthroscope and instruments go into the incisions to repair the damage. The torn portion of the meniscus is removed.  During this time, your surgeon may find a partial or complete tear in a cruciate ligament, such as the anterior cruciate ligament (ACL). A completely  torn cruciate ligament is reconstructed by taking tissue from another part of the body (grafting) and placing it into the injured area. This requires several larger incisions to complete the repair. Sometimes, open surgery is needed for collateral ligament injuries. If a collateral ligament is found to be injured, your surgeon may staple or suture the tear through a slightly larger incision on the side of the knee. AFTER THE PROCEDURE You will be taken to the recovery area where your progress will be monitored. When you are awake, stable, and taking fluids without complications, you will  be allowed to go home. This is usually the same day. However, more extensive repairs of a ligament may require an overnight stay.  The recovery time after repairing your meniscus or ligament depends on the amount of damage to these structures. It also depends on whether or not reconstructive knee surgery was needed.   A torn or stretched ligament (ligament sprain) may take 6-8 weeks to heal. It takes about the same amount of time if your surgeon removed a torn meniscus.  A repaired meniscus may require 6-12 weeks of recovery time.  A torn ligament needing reconstructive surgery may take 6-12 months to heal fully. Document Released: 12/04/2000 Document Revised: 12/12/2013 Document Reviewed: 05/05/2013 [facility] Patient Information 2015 [place], [place]. This information is not intended to replace advice given to you by your health care provider. Make sure you discuss any questions you have with your health care provider. Arthroscopic Procedure, Knee An arthroscopic procedure can find what is wrong with your knee. PROCEDURE Arthroscopy is a surgical technique that allows your orthopedic surgeon to diagnose and treat your knee injury with accuracy. They will look into your knee through a small instrument. This is almost like a small (pencil sized) telescope. Because arthroscopy affects your knee less than open knee surgery, you can anticipate a more rapid recovery. Taking an active role by following your caregiver's instructions will help with rapid and complete recovery. Use crutches, rest, elevation, ice, and knee exercises as instructed. The length of recovery depends on various factors including type of injury, age, physical condition, medical conditions, and your rehabilitation. Your knee is the joint between the large bones (femur and tibia) in your leg. Cartilage covers these bone ends which are smooth and slippery and allow your knee to bend and move smoothly. Two menisci, thick, semi-lunar shaped  pads of cartilage which form a rim inside the joint, help absorb shock and stabilize your knee. Ligaments bind the bones together and support your knee joint. Muscles move the joint, help support your knee, and take stress off the joint itself. Because of this all programs and physical therapy to rehabilitate an injured or repaired knee require rebuilding and strengthening your muscles. AFTER THE PROCEDURE  After the procedure, you will be moved to a recovery area until most of the effects of the medication have worn off. Your caregiver will discuss the test results with you.  Only take over-the-counter or prescription medicines for pain, discomfort, or fever as directed by your caregiver. SEEK MEDICAL CARE IF:   You have increased bleeding from your wounds.  You see redness, swelling, or have increasing pain in your wounds.  You have pus coming from your wound.  You have an oral temperature above 102 F (38.9 C).  You notice a bad smell coming from the wound or dressing.  You have severe pain with any motion of your knee. SEEK IMMEDIATE MEDICAL CARE IF:   You develop a rash.  You have difficulty breathing.  You have any allergic problems. Document Released: 12/04/2000 Document Revised: 02/29/2012 Document Reviewed: 06/27/2008 [facility] Patient Information 2015 [place]. This information is not intended to replace advice given to you by your health care provider. Make sure you discuss any questions you have with your health care provider. PATIENT INSTRUCTIONS POST-ANESTHESIA  IMMEDIATELY FOLLOWING SURGERY:  Do not drive or operate machinery for the first twenty four hours after surgery.  Do not make any important decisions for twenty four hours after surgery or while taking narcotic pain medications or sedatives.  If you develop intractable nausea and vomiting or a severe headache please notify your doctor immediately.  FOLLOW-UP:  Please make an appointment with your surgeon  as instructed. You do not need to follow up with anesthesia unless specifically instructed to do so.  WOUND CARE INSTRUCTIONS (if applicable):  Keep a dry clean dressing on the anesthesia/puncture wound site if there is drainage.  Once the wound has quit draining you may leave it open to air.  Generally you should leave the bandage intact for twenty four hours unless there is drainage.  If the epidural site drains for more than 36-48 hours please call the anesthesia department.  QUESTIONS?:  Please feel free to call your physician or the hospital operator if you have any questions, and they will be happy to assist you.

## 2015-04-01 ENCOUNTER — Encounter (HOSPITAL_COMMUNITY)
Admission: RE | Admit: 2015-04-01 | Discharge: 2015-04-01 | Disposition: A | Discharge status: Home / Self Care | Payer: PRIVATE HEALTH INSURANCE | Source: Ambulatory Visit | Attending: Orthopaedic Surgery | Admitting: Orthopaedic Surgery

## 2015-04-01 ENCOUNTER — Other Ambulatory Visit: Payer: Self-pay

## 2015-04-01 ENCOUNTER — Encounter (HOSPITAL_COMMUNITY): Payer: Self-pay

## 2015-04-01 DIAGNOSIS — Z01810 Encounter for preprocedural cardiovascular examination: Secondary | ICD-10-CM | POA: Insufficient documentation

## 2015-04-01 DIAGNOSIS — Z01812 Encounter for preprocedural laboratory examination: Secondary | ICD-10-CM | POA: Diagnosis not present

## 2015-04-01 HISTORY — DX: Gastro-esophageal reflux disease without esophagitis: K21.9

## 2015-04-01 LAB — COMPREHENSIVE METABOLIC PANEL
ALBUMIN: 4.1 g/dL (ref 3.5–5.2)
ALK PHOS: 98 U/L (ref 39–117)
ALT: 30 U/L (ref 0–53)
AST: 28 U/L (ref 0–37)
Anion gap: 12 (ref 5–15)
BILIRUBIN TOTAL: 1 mg/dL (ref 0.3–1.2)
BUN: 19 mg/dL (ref 6–23)
CHLORIDE: 101 mmol/L (ref 96–112)
CO2: 23 mmol/L (ref 19–32)
Calcium: 9.5 mg/dL (ref 8.4–10.5)
Creatinine, Ser: 1.33 mg/dL (ref 0.50–1.35)
GFR calc non Af Amer: 57 mL/min — ABNORMAL LOW (ref >90)
GFR, EST AFRICAN AMERICAN: 66 mL/min — AB (ref >90)
GLUCOSE: 100 mg/dL — AB (ref 70–99)
POTASSIUM: 3.7 mmol/L (ref 3.5–5.1)
Sodium: 136 mmol/L (ref 135–145)
Total Protein: 7.6 g/dL (ref 6.0–8.3)

## 2015-04-01 LAB — CBC WITH DIFFERENTIAL/PLATELET
Basophils Absolute: 0.1 10*3/uL (ref 0.0–0.1)
Basophils Relative: 1 % (ref 0–1)
EOS ABS: 0.3 10*3/uL (ref 0.0–0.7)
EOS PCT: 3 % (ref 0–5)
HCT: 45.9 % (ref 39.0–52.0)
Hemoglobin: 16.1 g/dL (ref 13.0–17.0)
LYMPHS ABS: 3 10*3/uL (ref 0.7–4.0)
Lymphocytes Relative: 27 % (ref 12–46)
MCH: 29.4 pg (ref 26.0–34.0)
MCHC: 35.1 g/dL (ref 30.0–36.0)
MCV: 83.9 fL (ref 78.0–100.0)
Monocytes Absolute: 1.5 10*3/uL — ABNORMAL HIGH (ref 0.1–1.0)
Monocytes Relative: 13 % — ABNORMAL HIGH (ref 3–12)
NEUTROS PCT: 56 % (ref 43–77)
Neutro Abs: 6.2 10*3/uL (ref 1.7–7.7)
Platelets: 363 10*3/uL (ref 150–400)
RBC: 5.47 MIL/uL (ref 4.22–5.81)
RDW: 12.7 % (ref 11.5–15.5)
WBC: 11.2 10*3/uL — ABNORMAL HIGH (ref 4.0–10.5)

## 2015-04-01 LAB — URINALYSIS, ROUTINE W REFLEX MICROSCOPIC
Bilirubin Urine: NEGATIVE
Glucose, UA: NEGATIVE mg/dL
HGB URINE DIPSTICK: NEGATIVE
KETONES UR: NEGATIVE mg/dL
Leukocytes, UA: NEGATIVE
Nitrite: NEGATIVE
PROTEIN: NEGATIVE mg/dL
Specific Gravity, Urine: 1.02 (ref 1.005–1.030)
Urobilinogen, UA: 0.2 mg/dL (ref 0.0–1.0)
pH: 6 (ref 5.0–8.0)

## 2015-04-01 MED ORDER — CHLORHEXIDINE GLUCONATE 4 % EX LIQD: 60.0000 mL | Freq: Once | CUTANEOUS | Status: DC

## 2015-04-01 NOTE — Pre-Procedure Instructions (Signed)
Patient in for PAT. Denies c/o SOB, palpitations or other complaints of at this  Time. Hr 118, sat is 97%, BP 136/90. EKG shows bigemeny. Has no prior history. Called PCP and obtained  EKG done 3 years ago. There is no bigemeny on that EKG. Showed EKG to Dr Jayme CloudGonzalez and due to new onset and no treatment, patient will be cancelled for now until sees cardiology Dr Hilda LiasKeeling called and agrees with this treatment plan. Cardiology called and appointment made for April 21 at 1100. Called and spoke with patient and wife who both verbalize understanding of this. Called OR scheduler to cancel case as well.

## 2015-04-04 ENCOUNTER — Encounter (HOSPITAL_COMMUNITY): Admission: RE | Payer: Self-pay | Source: Ambulatory Visit

## 2015-04-04 ENCOUNTER — Ambulatory Visit (HOSPITAL_COMMUNITY)
Admission: RE | Admit: 2015-04-04 | Payer: PRIVATE HEALTH INSURANCE | Source: Ambulatory Visit | Admitting: Orthopaedic Surgery

## 2015-04-04 SURGERY — ARTHROSCOPY, KNEE, WITH MEDIAL MENISCECTOMY
Anesthesia: General | Laterality: Left

## 2015-04-08 ENCOUNTER — Ambulatory Visit: Payer: Managed Care, Other (non HMO) | Admitting: Physical Therapy

## 2015-04-10 NOTE — Progress Notes (Signed)
Cardiology Office Note   Date:  04/10/2015   ID:  Marvin PertRandy A Deskins, DOB Feb 02, 1956, MRN [id number]  PCP:  Lelan PonsPASQUALE, GINA, PA  Cardiologist:   Charlton HawsPeter Tobi Groesbeck, MD   No chief complaint on file.     History of Present Illness: Marvin Bender is a 59 y.o. male who presents for surgical clearance due to abnormal ECG  Injured left shoulder a couple of months ago Seen 5 years ago with HTN and anxiety with 40 lb weight gain.  At that time was on calcium blocker and ACE for HTN Also on statin for elevated lipids  Had cardiopulmonary stress test ordered by Dr Sherene SiresWert in 2014 that was negative for circulatory limitation and most consistent with deconditioning  CT with pulmonary nodules no ILD  Last seen by Dr Sherene SiresWert for dyspnea in 2014 thought obesity was major issue  CT 03/30/14 no nodules seen mild interstitial changes I reviewed this scan and no coronary calcification was present   Some exertional dyspnea.  No cough or sputum quit smoking years ago.  Needs left knee arthroscopic surgery  He has high HR and PACls at rest does not note palpitations or syncope.  Denies excess ETOH  Has always been "high strung"    Past Medical History  Diagnosis Date  . Hypertension   . Anxiety   . GERD (gastroesophageal reflux disease)     Past Surgical History  Procedure Laterality Date  . No past surgeries       Current Outpatient Prescriptions  Medication Sig Dispense Refill  . HYDROcodone-acetaminophen (NORCO) 10-325 MG per tablet Take 1 tablet by mouth every 6 (six) hours as needed (tooth pain). (Patient not taking: Reported on 03/25/2015) 30 tablet 0  . LORazepam (ATIVAN) 0.5 MG tablet Take 0.5 mg by mouth every 8 (eight) hours as needed for anxiety.    Marland Kitchen. losartan (COZAAR) 100 MG tablet Take 100 mg by mouth daily.    . naproxen (NAPROSYN) 500 MG tablet Take 1 tablet (500 mg total) by mouth 2 (two) times daily with a meal. (Patient not taking: Reported on 03/25/2015) 30 tablet 0  . nortriptyline (PAMELOR) 25  MG capsule Take 25 mg by mouth at bedtime.    . ranitidine (ZANTAC) 150 MG tablet Take 150 mg by mouth daily.    . rosuvastatin (CRESTOR) 10 MG tablet Take 10 mg by mouth daily.    Marland Kitchen. triamterene-hydrochlorothiazide (DYAZIDE) 37.5-25 MG per capsule Take 1 capsule by mouth daily.     No current facility-administered medications for this visit.    Allergies:   Review of patient's allergies indicates no known allergies.    Social History:  The patient  reports that he quit smoking about 8 years ago. His smoking use included Cigarettes. He has a 40 pack-year smoking history. He has never used smokeless tobacco. He reports that he drinks alcohol. He reports that he does not use illicit drugs.   Family History:  The patient's family history includes Asthma in his mother; Heart disease in his mother and sister. There is no history of Colon cancer or Stomach cancer.    ROS:  Please see the history of present illness.   Otherwise, review of systems are positive for none.   All other systems are reviewed and negative.    PHYSICAL EXAM: VS:  There were no vitals taken for this visit. , BMI There is no weight on file to calculate BMI. Affect appropriate Healthy:  appears stated age HEENT: normal Neck  supple with no adenopathy JVP normal no bruits no thyromegaly Lungs clear with no wheezing and good diaphragmatic motion Heart:  S1/S2 no murmur, no rub, gallop or click PMI normal Abdomen: benighn, BS positve, no tenderness, no AAA no bruit.  No HSM or HJR Distal pulses intact with no bruits No edema Neuro non-focal Skin warm and dry No muscular weakness    EKG:  4/11  Sinus tachycardia rate 115 with coupled aberrant PAC;s    Recent Labs: 04/01/2015: ALT 30; BUN 19; Creatinine 1.33; Hemoglobin 16.1; Platelets 363; Potassium 3.7; Sodium 136    Lipid Panel No results found for: CHOL, TRIG, HDL, CHOLHDL, VLDL, LDLCALC, LDLDIRECT    Wt Readings from Last 3 Encounters:  01/28/15 275 lb 8  oz (124.966 kg)  04/11/14 277 lb 9.6 oz (125.919 kg)  01/20/14 272 lb (123.378 kg)      Other studies Reviewed: Additional studies/ records that were reviewed today include: Primary care records and Epic notes.    ASSESSMENT AND PLAN:  1.  Preop:  Concern for obesity ongoing dyspnea and arrhythmia.  F/U lexiscan myovue can't walk due to knee issues.  Echo to assess RV/LV function 2. PAC;s  Ambient high HR  Stop diuretic start beta blocker  3. COPD  No active wheezing f/u Wert   Current medicines are reviewed at length with the patient today.  The patient does not have concerns regarding medicines.  The following changes have been made:  Stop diuretic start Toprol  Labs/ tests ordered today include:  Echo and Lexiscan Myovue  No orders of the defined types were placed in this encounter.     Disposition:   FU with [place] office after tests     Signed, Charlton Haws, MD  04/10/2015 9:51 PM    [facility] [address] [place], [place], [place]  [address] Phone: [phone]; Fax: [phone]

## 2015-04-11 ENCOUNTER — Encounter: Payer: Self-pay | Admitting: *Deleted

## 2015-04-11 ENCOUNTER — Encounter: Payer: Self-pay | Admitting: Cardiovascular Disease

## 2015-04-11 ENCOUNTER — Ambulatory Visit (INDEPENDENT_AMBULATORY_CARE_PROVIDER_SITE_OTHER): Payer: PRIVATE HEALTH INSURANCE | Admitting: Cardiovascular Disease

## 2015-04-11 VITALS — BP 125/70 | HR 84 | Ht 73.0 in | Wt 265.4 lb

## 2015-04-11 DIAGNOSIS — R0602 Shortness of breath: Secondary | ICD-10-CM | POA: Diagnosis not present

## 2015-04-11 DIAGNOSIS — R9431 Abnormal electrocardiogram [ECG] [EKG]: Secondary | ICD-10-CM

## 2015-04-11 MED ORDER — METOPROLOL SUCCINATE ER 50 MG PO TB24: 50.0000 mg | ORAL_TABLET | Freq: Every day | ORAL | Status: DC

## 2015-04-11 NOTE — Patient Instructions (Signed)
Your physician recommends that you schedule a follow-up appointment in: After Stress test and ECHO  Your physician has requested that you have a lexiscan myoview. For further information please visit [email or web address]. Please follow instruction sheet, as given.  Your physician has requested that you have an echocardiogram. Echocardiography is a painless test that uses sound waves to create images of your heart. It provides your doctor with information about the size and shape of your heart and how well your heart's chambers and valves are working. This procedure takes approximately one hour. There are no restrictions for this procedure.  Start Toprol 50 mg one Tablet Daily  Stop Taking Dyazide  Thank you for choosing  [facility]!

## 2015-04-16 ENCOUNTER — Telehealth: Payer: Self-pay | Admitting: Cardiovascular Disease

## 2015-04-16 NOTE — Telephone Encounter (Signed)
Patient would like to speak with nurse regarding BP readings post medication change / tg

## 2015-04-17 ENCOUNTER — Encounter (HOSPITAL_COMMUNITY)
Admission: RE | Admit: 2015-04-17 | Discharge: 2015-04-17 | Disposition: A | Discharge status: Home / Self Care | Payer: PRIVATE HEALTH INSURANCE | Source: Ambulatory Visit | Attending: Cardiovascular Disease | Admitting: Cardiovascular Disease

## 2015-04-17 ENCOUNTER — Ambulatory Visit (HOSPITAL_COMMUNITY)
Admission: RE | Admit: 2015-04-17 | Discharge: 2015-04-17 | Disposition: A | Discharge status: Home / Self Care | Payer: PRIVATE HEALTH INSURANCE | Source: Ambulatory Visit | Attending: Cardiovascular Disease | Admitting: Cardiovascular Disease

## 2015-04-17 ENCOUNTER — Encounter (HOSPITAL_COMMUNITY): Payer: Self-pay

## 2015-04-17 DIAGNOSIS — I10 Essential (primary) hypertension: Secondary | ICD-10-CM | POA: Insufficient documentation

## 2015-04-17 DIAGNOSIS — Z87891 Personal history of nicotine dependence: Secondary | ICD-10-CM | POA: Diagnosis not present

## 2015-04-17 DIAGNOSIS — R0600 Dyspnea, unspecified: Secondary | ICD-10-CM | POA: Diagnosis not present

## 2015-04-17 DIAGNOSIS — R0602 Shortness of breath: Secondary | ICD-10-CM

## 2015-04-17 DIAGNOSIS — Z01810 Encounter for preprocedural cardiovascular examination: Secondary | ICD-10-CM | POA: Diagnosis not present

## 2015-04-17 DIAGNOSIS — E785 Hyperlipidemia, unspecified: Secondary | ICD-10-CM | POA: Insufficient documentation

## 2015-04-17 DIAGNOSIS — Z01818 Encounter for other preprocedural examination: Secondary | ICD-10-CM | POA: Insufficient documentation

## 2015-04-17 DIAGNOSIS — R9431 Abnormal electrocardiogram [ECG] [EKG]: Secondary | ICD-10-CM | POA: Insufficient documentation

## 2015-04-17 MED ORDER — SODIUM CHLORIDE 0.9 % IJ SOLN
10.0000 mL | INTRAMUSCULAR | Status: DC | PRN
  Administered 2015-04-17: 10 mL via INTRAVENOUS
  Filled 2015-04-17: qty 10

## 2015-04-17 MED ORDER — TECHNETIUM TC 99M SESTAMIBI - CARDIOLITE
30.0000 | Freq: Once | INTRAVENOUS | Status: AC | PRN
  Administered 2015-04-17: 30 via INTRAVENOUS

## 2015-04-17 MED ORDER — SODIUM CHLORIDE 0.9 % IJ SOLN
INTRAMUSCULAR | Status: AC
  Administered 2015-04-17: 10 mL via INTRAVENOUS
  Filled 2015-04-17: qty 3

## 2015-04-17 MED ORDER — REGADENOSON 0.4 MG/5ML IV SOLN
INTRAVENOUS | Status: AC
  Administered 2015-04-17: 0.4 mg via INTRAVENOUS
  Filled 2015-04-17: qty 5

## 2015-04-17 MED ORDER — REGADENOSON 0.4 MG/5ML IV SOLN
0.4000 mg | Freq: Once | INTRAVENOUS | Status: AC | PRN
  Administered 2015-04-17: 0.4 mg via INTRAVENOUS

## 2015-04-17 MED ORDER — TECHNETIUM TC 99M SESTAMIBI GENERIC - CARDIOLITE
10.0000 | Freq: Once | INTRAVENOUS | Status: AC | PRN
  Administered 2015-04-17: 10 via INTRAVENOUS

## 2015-04-17 NOTE — Progress Notes (Signed)
*  PRELIMINARY RESULTS* Echocardiogram 2D Echocardiogram has been performed.  Jeryl ColumbiaLLIOTT, Keyon Winnick 04/17/2015, 9:13 AM

## 2015-04-17 NOTE — Progress Notes (Signed)
Stress Lab Nurses Notes - [facility]  Emori A Stinger 04/17/2015 Reason for doing test: Surgical Clearance and abnormal ECG Type of test: Marlane HatcherLexiscan Cardiolite Nurse performing test: Parke PoissonPhyllis Billingsly, RN Nuclear Medicine Tech: Marcella DubsMiranda Womack Echo Tech: Not Applicable MD performing test: Branch/ M.Geni BersLenze PA Family MD: Lelan PonsGina Pasquale PA Test explained and consent signed: Yes.   IV started: Saline lock flushed, No redness or edema and Saline lock started in radiology Symptoms: Discomfort in throat Treatment/Intervention: None Reason test stopped: protocol completed After recovery IV was: Discontinued via X-ray tech and No redness or edema Patient to return to Nuc. Med at : 11:45 Patient discharged: Home Patient's Condition upon discharge was: stable Comments: During test BP 135/91 & HR 92.  Recovery BP 143/94 & HR 76. Symptoms resolved in recovery. Low Sodium diet instruction given, understands verbally. Erskine SpeedBillingsley, Yulisa Chirico T

## 2015-04-18 ENCOUNTER — Telehealth: Payer: Self-pay | Admitting: Cardiovascular Disease

## 2015-04-18 NOTE — Telephone Encounter (Signed)
Needs clearance sent to Dr.Keelings office / tg

## 2015-04-19 NOTE — Telephone Encounter (Signed)
Ok to have surgery Myovue low risk EF normal by echo  Make sure he started beta blocker

## 2015-04-19 NOTE — Telephone Encounter (Signed)
Will forward to Dr.Nishan. 

## 2015-04-19 NOTE — Telephone Encounter (Signed)
Faxed to Dr.Keeling, pt is on beta blocker

## 2015-04-24 ENCOUNTER — Other Ambulatory Visit: Payer: Self-pay | Admitting: Radiology

## 2015-04-24 NOTE — Patient Instructions (Signed)
Marvin Bender  04/24/2015   Your procedure is scheduled on:  04/30/2015  [facility] at 6:15 AM.  Call this number if you have problems the morning of surgery: [phone]   Remember:   Do not eat food or drink liquids after midnight.   Take these medicines the morning of surgery with A SIP OF WATER: Ativan, Cozaar, Metoprolol, Zantac   Do not wear jewelry, make-up or nail polish.  Do not wear lotions, powders, or perfumes. You may wear deodorant.  Do not shave 48 hours prior to surgery. Men may shave face and neck.  Do not bring valuables to the hospital.  [facility] responsible                  for any belongings or valuables.               Contacts, dentures or bridgework may not be worn into surgery.  Leave suitcase in the car. After surgery it may be brought to your room.  For patients admitted to the hospital, discharge time is determined by your                treatment team.               Patients discharged the day of surgery will not be allowed to drive  home.  Name and phone number of your driver: family  Special Instructions: Shower using CHG 2 nights before surgery and the night before surgery.  If you shower the day of surgery use CHG.  Use special wash - you have one bottle of CHG for all showers.  You should use approximately 1/3 of the bottle for each shower.   Please read over the following fact sheets that you were given: Care and Recovery After Surgery   Arthroscopic Procedure, Knee An arthroscopic procedure can find what is wrong with your knee. PROCEDURE Arthroscopy is a surgical technique that allows your orthopedic surgeon to diagnose and treat your knee injury with accuracy. They will look into your knee through a small instrument. This is almost like a small (pencil sized) telescope. Because arthroscopy affects your knee less than open knee surgery, you can anticipate a more rapid recovery. Taking an active role by following your caregiver's  instructions will help with rapid and complete recovery. Use crutches, rest, elevation, ice, and knee exercises as instructed. The length of recovery depends on various factors including type of injury, age, physical condition, medical conditions, and your rehabilitation. Your knee is the joint between the large bones (femur and tibia) in your leg. Cartilage covers these bone ends which are smooth and slippery and allow your knee to bend and move smoothly. Two menisci, thick, semi-lunar shaped pads of cartilage which form a rim inside the joint, help absorb shock and stabilize your knee. Ligaments bind the bones together and support your knee joint. Muscles move the joint, help support your knee, and take stress off the joint itself. Because of this all programs and physical therapy to rehabilitate an injured or repaired knee require rebuilding and strengthening your muscles. AFTER THE PROCEDURE  After the procedure, you will be moved to a recovery area until most of the effects of the medication have worn off. Your caregiver will discuss the test results with you.  Only take over-the-counter or prescription medicines for pain, discomfort, or fever as directed by your caregiver. SEEK MEDICAL CARE IF:   You have increased bleeding from your  wounds.  You see redness, swelling, or have increasing pain in your wounds.  You have pus coming from your wound.  You have an oral temperature above 102 F (38.9 C).  You notice a bad smell coming from the wound or dressing.  You have severe pain with any motion of your knee. SEEK IMMEDIATE MEDICAL CARE IF:   You develop a rash.  You have difficulty breathing.  You have any allergic problems. Document Released: 12/04/2000 Document Revised: 02/29/2012 Document Reviewed: 06/27/2008 [facility] Patient Information 2015 [place], [place]. This information is not intended to replace advice given to you by your health care provider. Make sure you discuss any  questions you have with your health care provider.

## 2015-04-25 ENCOUNTER — Encounter (HOSPITAL_COMMUNITY)
Admission: RE | Admit: 2015-04-25 | Discharge: 2015-04-25 | Disposition: A | Discharge status: Home / Self Care | Payer: PRIVATE HEALTH INSURANCE | Source: Ambulatory Visit | Attending: Orthopaedic Surgery | Admitting: Orthopaedic Surgery

## 2015-04-25 ENCOUNTER — Encounter (HOSPITAL_COMMUNITY): Payer: Self-pay

## 2015-04-25 ENCOUNTER — Other Ambulatory Visit: Payer: Self-pay

## 2015-04-25 DIAGNOSIS — Z01818 Encounter for other preprocedural examination: Secondary | ICD-10-CM | POA: Insufficient documentation

## 2015-04-25 DIAGNOSIS — M23204 Derangement of unspecified medial meniscus due to old tear or injury, left knee: Secondary | ICD-10-CM | POA: Insufficient documentation

## 2015-04-25 HISTORY — DX: Pure hypercholesterolemia, unspecified: E78.00

## 2015-04-25 LAB — BASIC METABOLIC PANEL
Anion gap: 7 (ref 5–15)
BUN: 14 mg/dL (ref 6–20)
CO2: 28 mmol/L (ref 22–32)
CREATININE: 0.94 mg/dL (ref 0.61–1.24)
Calcium: 9.3 mg/dL (ref 8.9–10.3)
Chloride: 102 mmol/L (ref 101–111)
Glucose, Bld: 100 mg/dL — ABNORMAL HIGH (ref 70–99)
POTASSIUM: 4.1 mmol/L (ref 3.5–5.1)
Sodium: 137 mmol/L (ref 135–145)

## 2015-04-25 LAB — CBC WITH DIFFERENTIAL/PLATELET
BASOS ABS: 0.1 10*3/uL (ref 0.0–0.1)
BASOS PCT: 2 % — AB (ref 0–1)
EOS ABS: 0.5 10*3/uL (ref 0.0–0.7)
EOS PCT: 6 % — AB (ref 0–5)
HEMATOCRIT: 43.6 % (ref 39.0–52.0)
HEMOGLOBIN: 14.8 g/dL (ref 13.0–17.0)
LYMPHS ABS: 2.2 10*3/uL (ref 0.7–4.0)
Lymphocytes Relative: 27 % (ref 12–46)
MCH: 28.7 pg (ref 26.0–34.0)
MCHC: 33.9 g/dL (ref 30.0–36.0)
MCV: 84.7 fL (ref 78.0–100.0)
Monocytes Absolute: 1 10*3/uL (ref 0.1–1.0)
Monocytes Relative: 13 % — ABNORMAL HIGH (ref 3–12)
NEUTROS PCT: 52 % (ref 43–77)
Neutro Abs: 4.2 10*3/uL (ref 1.7–7.7)
PLATELETS: 299 10*3/uL (ref 150–400)
RBC: 5.15 MIL/uL (ref 4.22–5.81)
RDW: 13.3 % (ref 11.5–15.5)
WBC: 8 10*3/uL (ref 4.0–10.5)

## 2015-04-25 LAB — URINALYSIS, ROUTINE W REFLEX MICROSCOPIC
Bilirubin Urine: NEGATIVE
GLUCOSE, UA: NEGATIVE mg/dL
Hgb urine dipstick: NEGATIVE
Ketones, ur: NEGATIVE mg/dL
LEUKOCYTES UA: NEGATIVE
Nitrite: NEGATIVE
PH: 6 (ref 5.0–8.0)
Protein, ur: NEGATIVE mg/dL
SPECIFIC GRAVITY, URINE: 1.015 (ref 1.005–1.030)
Urobilinogen, UA: 0.2 mg/dL (ref 0.0–1.0)

## 2015-04-25 NOTE — Progress Notes (Signed)
   04/25/15 0855  OBSTRUCTIVE SLEEP APNEA  Have you ever been diagnosed with sleep apnea through a sleep study? No  Do you snore loudly (loud enough to be heard through closed doors)?  0  Do you often feel tired, fatigued, or sleepy during the daytime? 0  Has anyone observed you stop breathing during your sleep? 0  Do you have, or are you being treated for high blood pressure? 1  BMI more than 35 kg/m2? 1  Age over 61 years old? 1  Neck circumference greater than 40 cm/16 inches? 0  Gender: 1  Obstructive Sleep Apnea Score 4

## 2015-04-25 NOTE — Pre-Procedure Instructions (Signed)
Patient given information to sign up for my chart at home. 

## 2015-04-25 NOTE — Pre-Procedure Instructions (Signed)
Dr Jayme CloudGonzalez aware of previous EKG and cancellation, as well as patient being seen by cardiology and cleared. New EKG shown to him also. No further orders given.

## 2015-04-29 NOTE — H&P (Signed)
Pamella PertRandy A Handy is an 59 y.o. male.   Chief Complaint: Left knee pain HPI: He started having left knee pain in early March of this year.  He woke up with medial left knee pain.  It continued.  It got worse.  I saw him on March 14.  I was concerned about a medial meniscus tear of the left knee as he had giving way, medial joint line pain and effusion.  MRI was done on 03/14/15 showing a tear of the medial meniscus of the left knee posterior horn and an insufficiency stress fracture of the distal medial femoral condyle.  He was scheduled for arthroscopy several weeks ago.  I had gone over the risks and imponderables of the elective arthroscopy procedure and he asked appropriate questions.  Physical therapy was scheduled post operative.  However, on pre-op evaluation at the hospital it was noted he had a heart irregularity -- bigeminy at times -- and surgery was postponed pending cardiac evaluation.  He has had this done now and is felt a good candidate for the procedure.  He has no heart or chest symptoms.  His knee is unchanged.  Past Medical History  Diagnosis Date  . Hypertension   . Anxiety   . GERD (gastroesophageal reflux disease)   . Hypercholesteremia     Past Surgical History  Procedure Laterality Date  . No past surgeries      Family History  Problem Relation Age of Onset  . Colon cancer Neg Hx   . Stomach cancer Neg Hx   . Asthma Mother   . Heart disease Mother   . Heart disease Sister    Social History:  reports that he quit smoking about 8 years ago. His smoking use included Cigarettes. He has a 40 pack-year smoking history. He has never used smokeless tobacco. He reports that he drinks alcohol. He reports that he does not use illicit drugs.  Allergies: No Known Allergies  No prescriptions prior to admission    No results found for this or any previous visit (from the past 48 hour(s)). No results found.  Review of Systems  Cardiovascular:       Hear disease and  hyperlipidemia, hypertension.  Musculoskeletal: Positive for joint pain (Left knee pain with instability since March of this year.  No known trauma.).    There were no vitals taken for this visit. Physical Exam  Constitutional: He is oriented to person, place, and time. He appears well-developed and well-nourished.  HENT:  Head: Normocephalic and atraumatic.  Eyes: Conjunctivae and EOM are normal. Pupils are equal, round, and reactive to light.  Neck: Normal range of motion. Neck supple.  Cardiovascular: Normal rate, regular rhythm and intact distal pulses.   Respiratory: Effort normal and breath sounds normal.  GI: Soft.  Musculoskeletal: He exhibits tenderness (Pain left knee with medial joint line pain, positive medial McMurray and ROM 0 to 105 degrees.  NV is intact.  Crepitus present.).  Neurological: He is alert and oriented to person, place, and time. He has normal reflexes.  Skin: Skin is warm and dry.  Psychiatric: He has a normal mood and affect. His behavior is normal. Judgment and thought content normal.     Assessment/Plan Left knee medial meniscus tear for elective outpatient arthroscopy. Hypertension History of heart rhythm abnormality, corrected.  Avery Eustice 04/29/2015, 3:08 PM

## 2015-04-30 ENCOUNTER — Encounter (HOSPITAL_COMMUNITY)
Admission: RE | Disposition: A | Discharge status: Home / Self Care | Payer: Self-pay | Source: Ambulatory Visit | Attending: Orthopaedic Surgery

## 2015-04-30 ENCOUNTER — Ambulatory Visit (HOSPITAL_COMMUNITY): Payer: PRIVATE HEALTH INSURANCE | Admitting: Anesthesiology

## 2015-04-30 ENCOUNTER — Ambulatory Visit (HOSPITAL_COMMUNITY)
Admission: RE | Admit: 2015-04-30 | Discharge: 2015-04-30 | Disposition: A | Discharge status: Home / Self Care | Payer: PRIVATE HEALTH INSURANCE | Source: Ambulatory Visit | Attending: Orthopaedic Surgery | Admitting: Orthopaedic Surgery

## 2015-04-30 ENCOUNTER — Encounter (HOSPITAL_COMMUNITY): Payer: Self-pay | Admitting: *Deleted

## 2015-04-30 DIAGNOSIS — M84352A Stress fracture, left femur, initial encounter for fracture: Secondary | ICD-10-CM | POA: Insufficient documentation

## 2015-04-30 DIAGNOSIS — K219 Gastro-esophageal reflux disease without esophagitis: Secondary | ICD-10-CM | POA: Diagnosis not present

## 2015-04-30 DIAGNOSIS — Y999 Unspecified external cause status: Secondary | ICD-10-CM | POA: Diagnosis not present

## 2015-04-30 DIAGNOSIS — S83242A Other tear of medial meniscus, current injury, left knee, initial encounter: Secondary | ICD-10-CM | POA: Diagnosis not present

## 2015-04-30 DIAGNOSIS — Z79899 Other long term (current) drug therapy: Secondary | ICD-10-CM | POA: Diagnosis not present

## 2015-04-30 DIAGNOSIS — X58XXXA Exposure to other specified factors, initial encounter: Secondary | ICD-10-CM | POA: Diagnosis not present

## 2015-04-30 DIAGNOSIS — I10 Essential (primary) hypertension: Secondary | ICD-10-CM | POA: Diagnosis not present

## 2015-04-30 DIAGNOSIS — Z87891 Personal history of nicotine dependence: Secondary | ICD-10-CM | POA: Insufficient documentation

## 2015-04-30 DIAGNOSIS — Y929 Unspecified place or not applicable: Secondary | ICD-10-CM | POA: Diagnosis not present

## 2015-04-30 DIAGNOSIS — Y939 Activity, unspecified: Secondary | ICD-10-CM | POA: Insufficient documentation

## 2015-04-30 DIAGNOSIS — M65862 Other synovitis and tenosynovitis, left lower leg: Secondary | ICD-10-CM | POA: Insufficient documentation

## 2015-04-30 DIAGNOSIS — M25562 Pain in left knee: Secondary | ICD-10-CM | POA: Diagnosis present

## 2015-04-30 HISTORY — PX: KNEE ARTHROSCOPY WITH MEDIAL MENISECTOMY: SHX5651

## 2015-04-30 SURGERY — ARTHROSCOPY, KNEE, WITH MEDIAL MENISCECTOMY
Anesthesia: General | Site: Knee | Laterality: Left

## 2015-04-30 MED ORDER — ONDANSETRON HCL 4 MG/2ML IJ SOLN
4.0000 mg | Freq: Once | INTRAMUSCULAR | Status: AC
  Administered 2015-04-30: 4 mg via INTRAVENOUS

## 2015-04-30 MED ORDER — EPHEDRINE SULFATE 50 MG/ML IJ SOLN
INTRAMUSCULAR | Status: AC
  Filled 2015-04-30: qty 1

## 2015-04-30 MED ORDER — MIDAZOLAM HCL 2 MG/2ML IJ SOLN
1.0000 mg | INTRAMUSCULAR | Status: DC | PRN
  Administered 2015-04-30 (×3): 2 mg via INTRAVENOUS
  Filled 2015-04-30 (×2): qty 2

## 2015-04-30 MED ORDER — MIDAZOLAM HCL 2 MG/2ML IJ SOLN
INTRAMUSCULAR | Status: AC
  Filled 2015-04-30: qty 2

## 2015-04-30 MED ORDER — GLYCOPYRROLATE 0.2 MG/ML IJ SOLN
INTRAMUSCULAR | Status: AC
  Filled 2015-04-30: qty 1

## 2015-04-30 MED ORDER — GLYCOPYRROLATE 0.2 MG/ML IJ SOLN
INTRAMUSCULAR | Status: DC | PRN
  Administered 2015-04-30: 0.2 mg via INTRAVENOUS

## 2015-04-30 MED ORDER — FENTANYL CITRATE (PF) 100 MCG/2ML IJ SOLN
25.0000 ug | INTRAMUSCULAR | Status: AC
  Administered 2015-04-30 (×2): 25 ug via INTRAVENOUS

## 2015-04-30 MED ORDER — DEXAMETHASONE SODIUM PHOSPHATE 4 MG/ML IJ SOLN
INTRAMUSCULAR | Status: AC
  Filled 2015-04-30: qty 1

## 2015-04-30 MED ORDER — SODIUM CHLORIDE 0.9 % IJ SOLN
INTRAMUSCULAR | Status: AC
  Filled 2015-04-30: qty 10

## 2015-04-30 MED ORDER — PROPOFOL 10 MG/ML IV BOLUS
INTRAVENOUS | Status: AC
  Filled 2015-04-30: qty 20

## 2015-04-30 MED ORDER — PROPOFOL 10 MG/ML IV BOLUS
INTRAVENOUS | Status: DC | PRN
  Administered 2015-04-30: 10 mg via INTRAVENOUS
  Administered 2015-04-30: 160 mg via INTRAVENOUS

## 2015-04-30 MED ORDER — BUPIVACAINE HCL (PF) 0.25 % IJ SOLN
INTRAMUSCULAR | Status: DC | PRN
Start: 2015-04-30 — End: 2015-04-30
  Administered 2015-04-30: 30 mL

## 2015-04-30 MED ORDER — BUPIVACAINE HCL (PF) 0.25 % IJ SOLN
INTRAMUSCULAR | Status: AC
  Filled 2015-04-30: qty 30

## 2015-04-30 MED ORDER — LIDOCAINE HCL (CARDIAC) 20 MG/ML IV SOLN
INTRAVENOUS | Status: DC | PRN
  Administered 2015-04-30: 50 mg via INTRAVENOUS

## 2015-04-30 MED ORDER — SODIUM CHLORIDE 0.9 % IR SOLN
Status: DC | PRN
Start: 2015-04-30 — End: 2015-04-30
  Administered 2015-04-30: 1000 mL

## 2015-04-30 MED ORDER — LIDOCAINE HCL (PF) 1 % IJ SOLN
INTRAMUSCULAR | Status: AC
  Filled 2015-04-30: qty 5

## 2015-04-30 MED ORDER — ONDANSETRON HCL 4 MG/2ML IJ SOLN: 4.0000 mg | Freq: Once | INTRAMUSCULAR | Status: DC | PRN

## 2015-04-30 MED ORDER — FENTANYL CITRATE (PF) 100 MCG/2ML IJ SOLN: 25.0000 ug | INTRAMUSCULAR | Status: DC | PRN

## 2015-04-30 MED ORDER — LACTATED RINGERS IR SOLN
Status: DC | PRN
  Administered 2015-04-30 (×4): 3000 mL

## 2015-04-30 MED ORDER — FENTANYL CITRATE (PF) 100 MCG/2ML IJ SOLN
INTRAMUSCULAR | Status: AC
  Filled 2015-04-30: qty 2

## 2015-04-30 MED ORDER — ARTIFICIAL TEARS OP OINT
TOPICAL_OINTMENT | OPHTHALMIC | Status: DC | PRN
  Administered 2015-04-30: 1 via OPHTHALMIC

## 2015-04-30 MED ORDER — SUCCINYLCHOLINE CHLORIDE 20 MG/ML IJ SOLN
INTRAMUSCULAR | Status: AC
  Filled 2015-04-30: qty 1

## 2015-04-30 MED ORDER — FENTANYL CITRATE (PF) 100 MCG/2ML IJ SOLN
INTRAMUSCULAR | Status: DC | PRN
Start: 2015-04-30 — End: 2015-04-30
  Administered 2015-04-30: 25 ug via INTRAVENOUS
  Administered 2015-04-30: 50 ug via INTRAVENOUS
  Administered 2015-04-30 (×2): 25 ug via INTRAVENOUS

## 2015-04-30 MED ORDER — LACTATED RINGERS IV SOLN
INTRAVENOUS | Status: DC | PRN
  Administered 2015-04-30 (×2): via INTRAVENOUS

## 2015-04-30 MED ORDER — FENTANYL CITRATE (PF) 250 MCG/5ML IJ SOLN
INTRAMUSCULAR | Status: AC
  Filled 2015-04-30: qty 5

## 2015-04-30 MED ORDER — HYDROCODONE-ACETAMINOPHEN 7.5-325 MG PO TABS: 1.0000 | ORAL_TABLET | ORAL | Status: DC | PRN

## 2015-04-30 MED ORDER — ONDANSETRON HCL 4 MG/2ML IJ SOLN
INTRAMUSCULAR | Status: AC
  Filled 2015-04-30: qty 2

## 2015-04-30 MED ORDER — DEXAMETHASONE SODIUM PHOSPHATE 4 MG/ML IJ SOLN
4.0000 mg | Freq: Once | INTRAMUSCULAR | Status: AC
  Administered 2015-04-30: 4 mg via INTRAVENOUS

## 2015-04-30 MED ORDER — LACTATED RINGERS IV SOLN
INTRAVENOUS | Status: DC
  Administered 2015-04-30: 07:00:00 via INTRAVENOUS

## 2015-04-30 SURGICAL SUPPLY — 61 items
BAG HAMPER (MISCELLANEOUS) ×4 IMPLANT
BANDAGE ELASTIC 6 VELCRO NS (GAUZE/BANDAGES/DRESSINGS) ×4 IMPLANT
BANDAGE ESMARK 4X12 BL STRL LF (DISPOSABLE) ×2 IMPLANT
BLADE SURG 15 STRL LF DISP TIS (BLADE) ×2 IMPLANT
BLADE SURG 15 STRL SS (BLADE) ×4
BLADE SURG SZ11 CARB STEEL (BLADE) ×4 IMPLANT
BNDG CMPR 12X4 ELC STRL LF (DISPOSABLE) ×2
BNDG ESMARK 4X12 BLUE STRL LF (DISPOSABLE) ×4
CLOTH BEACON ORANGE TIMEOUT ST (SAFETY) ×4 IMPLANT
CUFF TOURNIQUET SINGLE 34IN LL (TOURNIQUET CUFF) ×3 IMPLANT
CUFF TOURNIQUET SINGLE 44IN (TOURNIQUET CUFF) IMPLANT
CUTTER TOMCAT 4.0M (BURR) ×4 IMPLANT
DECANTER SPIKE VIAL GLASS SM (MISCELLANEOUS) ×4 IMPLANT
DRAPE PROXIMA HALF (DRAPES) ×4 IMPLANT
DRSG XEROFORM 1X8 (GAUZE/BANDAGES/DRESSINGS) ×4 IMPLANT
DURAPREP 26ML APPLICATOR (WOUND CARE) ×8 IMPLANT
ELECT MENISCUS 165MM 90D (ELECTRODE) IMPLANT
FORMALIN 10 PREFIL 480ML (MISCELLANEOUS) ×4 IMPLANT
GAUZE SPONGE 4X4 12PLY STRL (GAUZE/BANDAGES/DRESSINGS) ×3 IMPLANT
GAUZE SPONGE 4X4 16PLY XRAY LF (GAUZE/BANDAGES/DRESSINGS) ×4 IMPLANT
GLOVE BIO SURGEON STRL SZ 6.5 (GLOVE) ×2 IMPLANT
GLOVE BIO SURGEON STRL SZ8 (GLOVE) ×4 IMPLANT
GLOVE BIO SURGEON STRL SZ8.5 (GLOVE) ×4 IMPLANT
GLOVE BIO SURGEONS STRL SZ 6.5 (GLOVE) ×1
GLOVE BIOGEL PI IND STRL 7.0 (GLOVE) ×1 IMPLANT
GLOVE BIOGEL PI IND STRL 7.5 (GLOVE) ×1 IMPLANT
GLOVE BIOGEL PI INDICATOR 7.0 (GLOVE) ×2
GLOVE BIOGEL PI INDICATOR 7.5 (GLOVE) ×2
GOWN STRL REUS W/TWL LRG LVL3 (GOWN DISPOSABLE) ×4 IMPLANT
GOWN STRL REUS W/TWL XL LVL3 (GOWN DISPOSABLE) ×4 IMPLANT
HANDPIECE (MISCELLANEOUS) IMPLANT
HLDR LEG FOAM (MISCELLANEOUS) ×2 IMPLANT
IV LACTATED RINGER IRRG 3000ML (IV SOLUTION) ×16
IV LR IRRIG 3000ML ARTHROMATIC (IV SOLUTION) ×6 IMPLANT
KIT BLADEGUARD II DBL (SET/KITS/TRAYS/PACK) ×4 IMPLANT
KIT ROOM TURNOVER AP CYSTO (KITS) ×4 IMPLANT
KNIFE HOOK (BLADE) IMPLANT
KNIFE MENISECTOMY (BLADE) IMPLANT
LEG HOLDER FOAM (MISCELLANEOUS) ×2
MANIFOLD NEPTUNE II (INSTRUMENTS) ×4 IMPLANT
MARKER SKIN DUAL TIP RULER LAB (MISCELLANEOUS) ×4 IMPLANT
NDL HYPO 21X1.5 SAFETY (NEEDLE) ×1 IMPLANT
NDL SPNL 18GX3.5 QUINCKE PK (NEEDLE) ×1 IMPLANT
NEEDLE HYPO 21X1.5 SAFETY (NEEDLE) ×4 IMPLANT
NEEDLE SPNL 18GX3.5 QUINCKE PK (NEEDLE) ×4 IMPLANT
NS IRRIG 1000ML POUR BTL (IV SOLUTION) ×4 IMPLANT
PACK ARTHRO LIMB DRAPE STRL (MISCELLANEOUS) ×4 IMPLANT
PAD ABD 5X9 TENDERSORB (GAUZE/BANDAGES/DRESSINGS) ×4 IMPLANT
PAD ARMBOARD 7.5X6 YLW CONV (MISCELLANEOUS) ×4 IMPLANT
PADDING CAST COTTON 6X4 STRL (CAST SUPPLIES) ×4 IMPLANT
PADDING WEBRIL 6 STERILE (GAUZE/BANDAGES/DRESSINGS) ×3 IMPLANT
SET ARTHROSCOPY INST (INSTRUMENTS) ×4 IMPLANT
SET BASIN LINEN APH (SET/KITS/TRAYS/PACK) ×4 IMPLANT
SPONGE GAUZE 4X4 12PLY (GAUZE/BANDAGES/DRESSINGS) ×3 IMPLANT
SUT ETHILON 3 0 FSL (SUTURE) ×4 IMPLANT
SYR 30ML LL (SYRINGE) ×4 IMPLANT
TIP 0 DEGREE (MISCELLANEOUS) IMPLANT
TIP 30 DEGREE (MISCELLANEOUS) IMPLANT
TIP 70 DEGREE (MISCELLANEOUS) IMPLANT
TUBING FLOSTEADY ARTHRO PUMP (IRRIGATION / IRRIGATOR) ×4 IMPLANT
YANKAUER SUCT BULB TIP 10FT TU (MISCELLANEOUS) ×8 IMPLANT

## 2015-04-30 NOTE — Discharge Instructions (Signed)
Use crutches.  You may weight bear as tolerated.  Use ice to the left knee.  You may remove the dressing from the left knee when you desire.  You may cleanse with peroxide otherwise keep it dry.  You may apply Band-aid over each stitch.  Keep physical therapy appointments.  Keep appointment with Dr. Hilda LiasKeeling in about ten days.  Call his office at [phone] if any problem, or if after hours, the hospital at [phone].  Arthroscopic Procedure, Knee, Care After Refer to this sheet in the next few weeks. These discharge instructions provide you with general information on caring for yourself after you leave the hospital. Your health care provider may also give you specific instructions. Your treatment has been planned according to the most current medical practices available, but unavoidable complications sometimes occur. If you have any problems or questions after discharge, please call your health care provider. HOME CARE INSTRUCTIONS   It is normal to be sore for a couple days after surgery. See your health care provider if this seems to be getting worse rather than better.  Only take over-the-counter or prescription medicines for pain, discomfort, or fever as directed by your health care provider.  Take showers rather than baths, or as directed by your health care provider.  Change bandages (dressings) if necessary or as directed.  You may resume normal diet and activities as directed or allowed.  Avoid lifting and driving until you are directed otherwise.  Make an appointment to see your health care provider for stitches (suture) or staple removal as directed.  You may put ice on the area.  Put the ice in a plastic bag. Place a towel between your skin and the bag.  Leave the ice on for 15-20 minutes, three to four times per day for the first 2 days.  Elevate the knee above the level of your heart to reduce swelling, and avoid dangling the leg.  Do 10-15 ankle pumps (pointing  your toes toward you and then away from you) two to three times daily.  If you are given compression stockings to wear after surgery, use them for as long as your surgeon tells you (around 10-14 days).  Avoid smoking and exposure to secondhand smoke. SEEK MEDICAL CARE IF:   You have increased bleeding from your wounds.  You see redness or swelling or you have increasing pain in your wounds.  You have pus coming from your wound.  You have a fever or persistent symptoms for more than 2-3 days.  You notice a bad smell coming from the wound or dressing.  You have severe pain with any motion of your knee. SEEK IMMEDIATE MEDICAL CARE IF:   You develop a rash.  You have difficulty breathing.  You develop any reaction or side effects to medicines taken.  You develop pain in the calves or back of the knee.  You develop chest pain, shortness of breath, or difficulty breathing.  You develop numbness or tingling in the leg or foot. MAKE SURE YOU:   Understand these instructions.  Will watch your condition.  Will get help right away if you are not doing well or you get worse. Document Released: 06/26/2005 Document Revised: 12/12/2013 Document Reviewed: 05/04/2013 [facility] Patient Information 2015 [place], [place]. This information is not intended to replace advice given to you by your health care provider. Make sure you discuss any questions you have with your health care provider.

## 2015-04-30 NOTE — Anesthesia Procedure Notes (Signed)
Procedure Name: LMA Insertion Date/Time: 04/30/2015 7:39 AM Performed by: Pernell DupreADAMS, AMY A Pre-anesthesia Checklist: Patient identified, Emergency Drugs available, Timeout performed, Suction available and Patient being monitored Patient Re-evaluated:Patient Re-evaluated prior to inductionOxygen Delivery Method: Circle system utilized Preoxygenation: Pre-oxygenation with 100% oxygen Intubation Type: IV induction Ventilation: Two handed mask ventilation required LMA: LMA inserted LMA Size: 5.0 Number of attempts: 1 Placement Confirmation: positive ETCO2 and breath sounds checked- equal and bilateral Tube secured with: Tape Dental Injury: Teeth and Oropharynx as per pre-operative assessment

## 2015-04-30 NOTE — Op Note (Signed)
Marvin Bender:  Marvin Bender                  ACCOUNT NO.:  [id number]  MEDICAL RECORD NO.:  [id number]  LOCATION:  [facility]                          FACILITY:  [facility]  PHYSICIAN:  J. Darreld McleanWayne Duell Holdren, M.D. DATE OF BIRTH:  [birth year]  DATE OF PROCEDURE:  04/30/2015 DATE OF DISCHARGE:                              OPERATIVE REPORT   PREOPERATIVE DIAGNOSIS:  Tear medial meniscus of the left knee.  POSTOPERATIVE DIAGNOSIS:  Tear medial meniscus of the left knee.  PROCEDURE:  Operative arthroscopy of the left knee, partial medial meniscectomy.  ANESTHESIA:  Spinal.  TOURNIQUET TIME:  23 minutes.  DRAINS:  No drains.  SURGEON:  J. Darreld McleanWayne Robet Crutchfield, MD  INDICATIONS:  The patient has pain and tenderness in his left knee since the first week in March, he awoke with pain in the knee.  Pain got worse and started giving way of the knee.  The knee had swelling.  Some also ordered an MRI.  An MRI done on March 14, 2015, showed a tear of the medial meniscus of the left knee with an insufficiency type injury fracture of the medial femoral condyle.  I recommended arthroscopy.  I went over the risks and imponderables.  We set him up for surgery. However, during the preop, it was noted he had a cardiac arrhythmia with runs of bigeminy.  He has been seen by Cardiology, given medication and is having no further cardiac arrhythmias.  He never had chest pain or any signs of an MI.  He is now being cleared by Cardiology.  Again, he understands the procedure, understands the need for physical therapy postoperatively.  DESCRIPTION OF PROCEDURE:  The patient was seen in the holding area. The left knee was identified as correct surgical site.  I placed a mark on the left knee.  He was brought to the operating room, given general anesthesia while supine on the operating room table.  Tourniquet and leg holder placed  deflated left upper thigh.  He was prepped and draped in the usual manner.  A generalized time-out  identifying the patient as Mr. Marvin Bender doing his left knee for arthroscopy of the knee.  All instrumentation was properly positioned and working.  The OR team knew each other.  An inflow cannula was inserted medially, and lactated Ringer's was instilled by an infusion pump.  Arthroscope inserted laterally, and the knee systematically examined.  Suprapatellar pouch has mild synovitis, undersurface of the patella had some mild grade 2 to grade 3 changes. Medially, there was a tear of the medial meniscus or stellate diffuse type tear in the posterior horn.  There were grade 2-3 changes in the medial femoral condyle and tibial plateau.  Anterior cruciate was intact.  It was difficult to see laterally due to excessive synovitis and by labor I removed the scope, put it medially and we could see laterally and got very good pictures laterally.  Permanent pictures were taken throughout the case.  There were no deficits laterally and had grade 2 changes.  On the medial side from the lateral portal, a meniscal punch and meniscal shaver were used and good smooth and contour was obtained in the remaining part of  the medial meniscus cartilage.  I could see very well.  No apparent loose bodies or retained fragments.  The knee was irrigated in main part with lactated Ringer's.  Wound was then reapproximated using 3-0 nylon interrupted vertical mattress manner. Marcaine 0.25% instilled in each portal.  Tourniquet deflated to 23 minutes.  The patient tolerated the procedure well and went to recovery in good condition.  Be an outpatient to give appropriate analgesia.  If there are any difficulties, contact me through the office or hospital beeper system.          ______________________________ Shela CommonsJ. Darreld McleanWayne Constance Hackenberg, M.D.     JWK/MEDQ  D:  04/30/2015  T:  04/30/2015  Job:  [id number]

## 2015-04-30 NOTE — Transfer of Care (Signed)
Immediate Anesthesia Transfer of Care Note  Patient: Marvin Bender  Procedure(s) Performed: Procedure(s): LEFT KNEE ARTHROSCOPY WITH PARTIAL MEDIAL MENISECTOMY (Left)  Patient Location: PACU  Anesthesia Type:General  Level of Consciousness: awake, alert , oriented and patient cooperative  Airway & Oxygen Therapy: Patient Spontanous Breathing and Patient connected to face mask oxygen  Post-op Assessment: Report given to RN and Post -op Vital signs reviewed and stable  Post vital signs: Reviewed and stable  Last Vitals:  Filed Vitals:   04/30/15 0725  BP: 120/86  Temp:   Resp: 29    Complications: No apparent anesthesia complications

## 2015-04-30 NOTE — Brief Op Note (Signed)
04/30/2015  8:25 AM  PATIENT:  Marvin Bender  59 y.o. male  PRE-OPERATIVE DIAGNOSIS:  tear left knee medial meniscus  POST-OPERATIVE DIAGNOSIS:  tear left knee medial meniscus  PROCEDURE:  Procedure(s): LEFT KNEE ARTHROSCOPY WITH PARTIAL MEDIAL MENISECTOMY (Left)  SURGEON:  Surgeon(s) and Role:    * Darreld McleanWayne Marl Seago, MD - Primary  PHYSICIAN ASSISTANT:   ASSISTANTS: none   ANESTHESIA:   general  EBL:  Total I/O In: 900 [I.V.:900] Out: 5 [Blood:5]  BLOOD ADMINISTERED:none  DRAINS: none   LOCAL MEDICATIONS USED:  MARCAINE     SPECIMEN:  Source of Specimen:  Left knee medial meniscus shavings  DISPOSITION OF SPECIMEN:  PATHOLOGY  COUNTS:  YES  TOURNIQUET:   Total Tourniquet Time Documented: Thigh (Left) - 23 minutes Total: Thigh (Left) - 23 minutes   DICTATION: .Other Dictation: Dictation Number [id number]  PLAN OF CARE: Discharge to home after PACU  PATIENT DISPOSITION:  PACU - hemodynamically stable.   Delay start of Pharmacological VTE agent (>24hrs) due to surgical blood loss or risk of bleeding: not applicable

## 2015-04-30 NOTE — Progress Notes (Signed)
The History and Physical is unchanged. I have examined the patient. The patient is medically able to have surgery on the left knee . Marvin Bender  

## 2015-04-30 NOTE — Anesthesia Postprocedure Evaluation (Signed)
  Anesthesia Post-op Note  Patient: Marvin Bender  Procedure(s) Performed: Procedure(s): LEFT KNEE ARTHROSCOPY WITH PARTIAL MEDIAL MENISECTOMY (Left)  Patient Location: PACU  Anesthesia Type:General  Level of Consciousness: awake, alert , oriented and patient cooperative  Airway and Oxygen Therapy: Patient Spontanous Breathing  Post-op Pain: none  Post-op Assessment: Post-op Vital signs reviewed, Patient's Cardiovascular Status Stable, Respiratory Function Stable, Patent Airway, No signs of Nausea or vomiting and Pain level controlled  Post-op Vital Signs: Reviewed and stable  Last Vitals:  Filed Vitals:   04/30/15 0901  BP: 104/67  Pulse: 71  Temp:   Resp: 17    Complications: No apparent anesthesia complications

## 2015-04-30 NOTE — Anesthesia Preprocedure Evaluation (Signed)
Anesthesia Evaluation  Patient identified by MRN, date of birth, ID band Patient awake    Reviewed: Allergy & Precautions, NPO status , Patient's Chart, lab work & pertinent test results, reviewed documented beta blocker date and time   Airway Mallampati: III  TM Distance: >3 FB Neck ROM: Full  Mouth opening: Limited Mouth Opening  Dental  (+) Teeth Intact, Dental Advisory Given   Pulmonary shortness of breath, former smoker,  breath sounds clear to auscultation        Cardiovascular hypertension, Pt. on home beta blockers and Pt. on medications + dysrhythmias (PAC's started on beta blockers) Rhythm:Regular Rate:Normal     Neuro/Psych  Headaches, PSYCHIATRIC DISORDERS Anxiety    GI/Hepatic GERD-  Medicated and Controlled,  Endo/Other    Renal/GU      Musculoskeletal   Abdominal   Peds  Hematology   Anesthesia Other Findings   Reproductive/Obstetrics                             Anesthesia Physical Anesthesia Plan  ASA: III  Anesthesia Plan: General   Post-op Pain Management:    Induction: Intravenous  Airway Management Planned: LMA  Additional Equipment:   Intra-op Plan:   Post-operative Plan: Extubation in OR  Informed Consent: I have reviewed the patients History and Physical, chart, labs and discussed the procedure including the risks, benefits and alternatives for the proposed anesthesia with the patient or authorized representative who has indicated his/her understanding and acceptance.     Plan Discussed with:   Anesthesia Plan Comments: (Possible GOT with glidescope discussed and he agrees.)        Anesthesia Quick Evaluation

## 2015-05-01 ENCOUNTER — Encounter (HOSPITAL_COMMUNITY): Payer: Self-pay | Admitting: Orthopaedic Surgery

## 2015-05-02 ENCOUNTER — Ambulatory Visit: Payer: PRIVATE HEALTH INSURANCE | Attending: Orthopaedic Surgery | Admitting: Physical Therapy

## 2015-05-02 DIAGNOSIS — M25562 Pain in left knee: Secondary | ICD-10-CM | POA: Diagnosis not present

## 2015-05-02 DIAGNOSIS — M25662 Stiffness of left knee, not elsewhere classified: Secondary | ICD-10-CM

## 2015-05-02 NOTE — Therapy (Signed)
[facility] [address] [place], [place], [address] Phone: [phone]   Fax:  [phone]  Physical Therapy Evaluation  Patient Details  Name: Marvin PertRandy A Bender MRN: [id number] Date of Birth: [birth year] Referring Provider:  Darreld McleanKeeling, Wayne, MD  Encounter Date: 05/02/2015      PT End of Session - 05/02/15 1456    Visit Number 1   Number of Visits 12   Date for PT Re-Evaluation 06/27/15   PT Start Time 0230   PT Stop Time 0312   PT Time Calculation (min) 42 min   Activity Tolerance Patient tolerated treatment well   Behavior During Therapy [facility] for tasks assessed/performed      Past Medical History  Diagnosis Date  . Hypertension   . Anxiety   . GERD (gastroesophageal reflux disease)   . Hypercholesteremia     Past Surgical History  Procedure Laterality Date  . No past surgeries    . Knee arthroscopy with medial menisectomy Left 04/30/2015    Procedure: LEFT KNEE ARTHROSCOPY WITH PARTIAL MEDIAL MENISECTOMY;  Surgeon: Darreld McleanWayne Keeling, MD;  Location: [facility];  Service: Orthopedics;  Laterality: Left;    There were no vitals filed for this visit.  Visit Diagnosis:  Left knee pain - Plan: PT plan of care cert/re-cert  Knee stiffness, left - Plan: PT plan of care cert/re-cert      Subjective Assessment - 05/02/15 1525    Subjective Just had surgery two days ago and I'm hurting.  Don't want to get on any machines.   Limitations Walking   Patient Stated Goals Get out of pain.   Pain Score 8    Pain Location Knee   Pain Orientation Left   Pain Descriptors / Indicators Aching;Burning   Pain Type Surgical pain            [facility] PT Assessment - 05/02/15 0001    Assessment   Medical Diagnosis Left knee meniscectomy   Onset Date --  04/30/15--surgery date.   Balance Screen   Has the patient fallen in the past 6 months No   Has the patient had a decrease in activity level because of a fear of falling?  No   Is the patient  reluctant to leave their home because of a fear of falling?  No   ROM / Strength   AROM / PROM / Strength AROM;Strength   AROM   Overall AROM Comments -20 degrees to 88 degrees for left knee AROM.   Strength   Overall Strength Comments Notable left quadriceps atrophy and decreased activiation of this muscular group.  Left hip strength= 4/5.   Palpation   Palpation --  Pt c/o diffuse left anterior knee pain.   Ambulation/Gait   Ambulation/Gait --  NWB (pt choice) over left LE with bil axillary crutches.                   [facility] Adult PT Treatment/Exercise - 05/02/15 0001    Modalities   Modalities Cryotherapy   Cryotherapy   Number Minutes Cryotherapy 15 Minutes   Cryotherapy Location Knee   Type of Cryotherapy --  Medium vasopneumatic with left LE elevated.                     PT Long Term Goals - 05/02/15 1509    PT LONG TERM GOAL #1   Title Ind with HEP.   Time 4   Period Weeks   Status New   PT  LONG TERM GOAL #2   Title Full active left knee extension in order to normalize gait.   Time 4   Period Weeks   Status New   PT LONG TERM GOAL #3   Title Active right  knee flexion to 120 degrees+ so the patient can perform functional tasks and do so with pain not > 2-3/10.   Time 4   Period Weeks   Status New   PT LONG TERM GOAL #4   Title Increase right knee strength to a solid 4+/5 to provide good stability for accomplishment of functional activities   Time 4   Period Weeks   Status New   PT LONG TERM GOAL #5   Title Ambulate independently without assistive device a community distance with pain not > 3/10.               Plan - 05/02/15 1500    Clinical Impression Statement The patient underwent a left knee meniscectomy on 04/30/15.  He still has his post-surgical dressing on and is to leave it on at this time.  Over the weekend he will remove and cover sutures with sterile bandaids.  His current pain-level is an 8/10.  He has not achieved  full extension at this time and he was shown exercies to acheive this.  It was explained how important it is to be compliant to her HEP.   Pt will benefit from skilled therapeutic intervention in order to improve on the following deficits Pain;Decreased activity tolerance;Decreased range of motion;Increased edema   PT Frequency 3x / week   PT Duration 4 weeks   PT Treatment/Interventions Passive range of motion;Gait training;Cryotherapy;Electrical Stimulation;Neuromuscular re-education   PT Next Visit Plan PROM to achieve full left knee extension; Nustep at level one for range of motion.  QS and SLR.  VMS to quadriceps.   Consulted and Agree with Plan of Care Patient         Problem List Patient Active Problem List   Diagnosis Date Noted  . Multiple pulmonary nodules 09/30/2013  . DYSPNEA 01/09/2010  . HYPERLIPIDEMIA 01/07/2010  . ANXIETY 01/07/2010  . HYPERTENSION 01/07/2010  . HEADACHE, CHRONIC 01/07/2010    Amanada Philbrick, [place] MPT 05/02/2015, 3:31 PM  [facility] [address] [place], [place], [address] Phone: [phone]   Fax:  [phone]

## 2015-05-02 NOTE — Patient Instructions (Signed)
     Heel l Slides   Squeeze pelvic floor and hold. Slide left heel along bed towards bottom. Hold for _5__ seconds. Slide back to flat knee position. Repeat _10__ times. Do _3__ times a day. Repeat with other leg.    Copyright  VHI. All rights reserved.  Advanced Straight Leg Raise  Keep right foot flat on table, tighten left thigh muscle and raise left leg ~ 18 inches.   http://orth.exer.us/1108   Copyright  VHI. All rights reserved.  Quad Sets   Slowly tighten thigh muscles of straight, left leg while counting out loud to __10__. Relax. Repeat __10__ times. Do _3___ sessions per day.  http://gt2.exer.us/292   Copyright  VHI. All rights reserved.

## 2015-05-03 ENCOUNTER — Ambulatory Visit: Payer: Managed Care, Other (non HMO) | Admitting: Adult Health

## 2015-05-08 ENCOUNTER — Encounter: Payer: Self-pay | Admitting: Physical Therapy

## 2015-05-08 ENCOUNTER — Ambulatory Visit: Payer: PRIVATE HEALTH INSURANCE | Admitting: Physical Therapy

## 2015-05-08 DIAGNOSIS — M25562 Pain in left knee: Secondary | ICD-10-CM

## 2015-05-08 DIAGNOSIS — M25662 Stiffness of left knee, not elsewhere classified: Secondary | ICD-10-CM

## 2015-05-08 NOTE — Therapy (Addendum)
[facility] [place], [place], [address] Phone: [phone]   Fax:  [phone]  Physical Therapy Treatment  Patient Details  Name: Marvin Bender MRN: [id number] Date of Birth: 10-30-1956 Referring Provider:  Daiva Huge, PA  Encounter Date: 05/08/2015      PT End of Session - 05/08/15 1026    Visit Number 2   Number of Visits 12   Date for PT Re-Evaluation 06/27/15   PT Start Time 1020   PT Stop Time 1114   PT Time Calculation (min) 54 min   Activity Tolerance Patient tolerated treatment well   Behavior During Therapy [facility] for tasks assessed/performed      Past Medical History  Diagnosis Date  . Hypertension   . Anxiety   . GERD (gastroesophageal reflux disease)   . Hypercholesteremia     Past Surgical History  Procedure Laterality Date  . No past surgeries    . Knee arthroscopy with medial menisectomy Left 04/30/2015    Procedure: LEFT KNEE ARTHROSCOPY WITH PARTIAL MEDIAL MENISECTOMY;  Surgeon: Sanjuana Kava, MD;  Location: [facility];  Service: Orthopedics;  Laterality: Left;    There were no vitals filed for this visit.  Visit Diagnosis:  Left knee pain  Knee stiffness, left      Subjective Assessment - 05/08/15 1025    Subjective States that he has been changing surgical gauze and doing exercises that Dr. Luna Glasgow directed. States that he is ready to get back to playing golf and is tired of sitting around.   Limitations Walking   Patient Stated Goals Get out of pain.   Currently in Pain? Yes   Pain Score 8    Pain Location Knee   Pain Orientation Left   Pain Descriptors / Indicators Sore;Sharp  States that occasionally he has the sharp pain in L knee   Pain Type Surgical pain            [facility] PT Assessment - 05/08/15 0001    Assessment   Medical Diagnosis Left knee meniscectomy   Onset Date 04/30/15   Next MD Visit 05/10/2015   AROM   Overall AROM  Deficits   Overall AROM Comments -10-104  deg L knee                     [facility] Adult PT Treatment/Exercise - 05/08/15 0001    Exercises   Exercises Knee/Hip   Knee/Hip Exercises: Aerobic   Stationary Bike NuStep L1 x12 min for ROM   Knee/Hip Exercises: Standing   Other Standing Knee Exercises L hip extension x30 reps AROM   Knee/Hip Exercises: Supine   Quad Sets Strengthening;Left;3 sets;10 reps   Short Armed forces operational officer;Other (comment)  with VMS for L quad strengthening   Straight Leg Raises AROM;Left;3 sets;10 reps   Straight Leg Raise with External Rotation AROM;Left;3 sets;10 reps   Knee/Hip Exercises: Sidelying   Hip ABduction AROM;Left;3 sets;10 reps   Hip ADduction AROM;Left;3 sets;10 reps   Knee/Hip Exercises: Prone   Hip Extension --   Modalities   Modalities Cryotherapy   Cryotherapy   Number Minutes Cryotherapy 15 Minutes   Cryotherapy Location Knee   Type of Cryotherapy Other (comment)  Vasopneumatic   Acupuncturist Location --   Printmaker Action --   Electrical Stimulation Parameters --   Electrical Stimulation Goals --   Manual Therapy   Manual Therapy Passive ROM   Passive ROM L knee  into flexion                PT Education - 05/08/15 1036    Education provided Yes   Education Details HEP- 4 way SLR and SLR with ER   Person(s) Educated Patient   Methods Explanation;Verbal cues;Handout   Comprehension Verbalized understanding;Returned demonstration;Verbal cues required             PT Long Term Goals - 05/08/15 1026    PT LONG TERM GOAL #1   Title Ind with HEP.   Time 4   Period Weeks   Status On-going   PT LONG TERM GOAL #2   Title Full active left knee extension in order to normalize gait.   Time 4   Period Weeks   Status On-going   PT LONG TERM GOAL #3   Title Active right  knee flexion to 120 degrees+ so the patient can perform functional tasks and do so with pain not > 2-3/10.   Time 4    Period Weeks   Status On-going   PT LONG TERM GOAL #4   Title Increase right knee strength to a solid 4+/5 to provide good stability for accomplishment of functional activities   Time 4   Period Weeks   Status On-going   PT LONG TERM GOAL #5   Title Ambulate independently without assistive device a community distance with pain not > 3/10.   Status On-going               Plan - 05/08/15 1054    Clinical Impression Statement Patient tolerated treatment well without complaint of increased pain during exercises. Accepted new HEP exercises and returned good demonstration for all exercises. VMS with SAQ was not accomplished this session secondary to stitches and bandaids still in place over incision sites. AROM measuremtns have improved to -10-104 deg from initial evaluation. Normal modalties response noted following removal of the modalties. Firm end feels noted during manual therapy. Experienced 6-7/10 pain following treatment..   Pt will benefit from skilled therapeutic intervention in order to improve on the following deficits Pain;Decreased activity tolerance;Decreased range of motion;Increased edema   PT Frequency 3x / week   PT Duration 4 weeks   PT Treatment/Interventions Passive range of motion;Gait training;Cryotherapy;Electrical Stimulation;Neuromuscular re-education   PT Next Visit Plan Continue per PT POC. Sees Dr. Luna Glasgow 05/10/2015   Consulted and Agree with Plan of Care Patient        Problem List Patient Active Problem List   Diagnosis Date Noted  . Multiple pulmonary nodules 09/30/2013  . DYSPNEA 01/09/2010  . HYPERLIPIDEMIA 01/07/2010  . ANXIETY 01/07/2010  . HYPERTENSION 01/07/2010  . HEADACHE, CHRONIC 01/07/2010    Ahmed Prima, PTA 05/08/2015 11:22 AM PHYSICAL THERAPY DISCHARGE SUMMARY  Visits from Start of Care: 2.  Current functional level related to goals / functional outcomes: Please see above.   Remaining deficits: Continued knee pain and  loss of ROM.   Education / Equipment: HEP.  Plan: Patient agrees to discharge.  Patient goals were not met. Patient is being discharged due to not returning since the last visit.  ?????      [facility] [place], [place], [address] Phone: [phone]   Fax:  [phone]  Mali Applegate MPT

## 2015-05-08 NOTE — Patient Instructions (Signed)
Straight Leg Raise: With External Leg Rotation   Lie on back with right leg straight, opposite leg bent. Rotate straight leg out and lift __4__ inches. Repeat __10__ times per set. Do __3__ sets per session. Do _2___ sessions per day.  http://orth.exer.us/728   Copyright  VHI. All rights reserved.  Strengthening: Straight Leg Raise (Phase 1)   Tighten muscles on front of right thigh, then lift leg _4___ inches from surface, keeping knee locked.  Repeat _10___ times per set. Do __3__ sets per session. Do __2__ sessions per day.  http://orth.exer.us/614   Copyright  VHI. All rights reserved.  Strengthening: Hip Abduction (Side-Lying)   Tighten muscles on front of left thigh, then lift leg _4___ inches from surface, keeping knee locked.  Repeat _10___ times per set. Do _3___ sets per session. Do _2___ sessions per day.  http://orth.exer.us/622   Copyright  VHI. All rights reserved.  Strengthening: Hip Adduction (Side-Lying)   Tighten muscles on front of right thigh, then lift leg _4___ inches from surface, keeping knee locked.  Repeat _10___ times per set. Do _3___ sets per session. Do __2__ sessions per day.  http://orth.exer.us/624   Copyright  VHI. All rights reserved.  Strengthening: Hip Extension (Prone)   Tighten muscles on front of left thigh, then lift leg _4___ inches from surface, keeping knee locked. Repeat __10__ times per set. Do _3___ sets per session. Do __2__ sessions per day.  http://orth.exer.us/620   Copyright  VHI. All rights reserved.

## 2015-05-15 ENCOUNTER — Encounter: Payer: PRIVATE HEALTH INSURANCE | Admitting: Physical Therapy

## 2016-04-22 ENCOUNTER — Telehealth: Payer: Self-pay

## 2016-04-22 ENCOUNTER — Other Ambulatory Visit: Payer: Self-pay | Admitting: *Deleted

## 2016-04-22 ENCOUNTER — Other Ambulatory Visit: Payer: Self-pay

## 2016-04-22 MED ORDER — METOPROLOL SUCCINATE ER 50 MG PO TB24: 50.0000 mg | ORAL_TABLET | Freq: Every day | ORAL | Status: DC

## 2016-04-22 NOTE — Telephone Encounter (Signed)
Follow Up:; ° ° °Returning your call. °

## 2016-04-22 NOTE — Telephone Encounter (Signed)
Left message for patient to call back. Patient needs to schedule follow-up appointment with Dr. Eden EmmsNishan.

## 2016-04-22 NOTE — Telephone Encounter (Signed)
Patient needs a refill on Metoprolol and is due for a follow-up appointment. Made appointment to see Dr. Eden EmmsNishan in June, and sent refill to patient's pharmacy. Patient verbalized understanding.

## 2016-06-02 NOTE — Progress Notes (Signed)
Patient ID: Marvin Bender, male   DOB: Jun 17, 1956, 60 y.o.   MRN: [id number]     Cardiology Office Note   Date:  06/03/2016   ID:  Marvin Bender, DOB 02-22-1956, MRN [id number]  PCP:  Lelan Pons, PA  Cardiologist:   Charlton Haws, MD   No chief complaint on file.     History of Present Illness: Marvin Bender is a 60 y.o. male who was seen 04/11/15 for surgical clearance due to abnormal ECG  Injured left knee in March  Seen 5 years ago with HTN and anxiety with 40 lb weight gain.  At that time was on calcium blocker and ACE for HTN Also on statin for elevated lipids  Had cardiopulmonary stress test ordered by Dr Sherene Sires in 2014 that was negative for circulatory limitation and most consistent with deconditioning  CT with pulmonary nodules no ILD  Last seen by Dr Sherene Sires for dyspnea in 2014 thought obesity was major issue  CT 03/30/14 no nodules seen mild interstitial changes I reviewed this scan and no coronary calcification was present   Some exertional dyspnea.  No cough or sputum quit smoking years ago.  Needs left knee arthroscopic surgery  He has high HR and PACls at rest does not note palpitations or syncope.  Denies excess ETOH  Has always been "high strung"  04/17/15 low risk myovue:  Reviewed  IMPRESSION: 1. Mild reversible inferoseptal wall defect. Finding maybe due to adjacent gut radiotracer in the resting images vs mild inferoseptal ischemia.  2. Normal left ventricular wall motion.  3. Left ventricular ejection fraction 71%  04/17/15 Echo normal EF no bad valves Left ventricle: The cavity size was normal. Wall thickness was  normal. Systolic function was normal. The estimated ejection  fraction was in the range of 55% to 60%. Left ventricular  diastolic function parameters were normal. - Aortic valve: Valve area (VTI): 2.5 cm^2. Valve area (Vmax): 2.28  cm^2. Valve area (Vmean): 2.17 cm^2. - Left atrium: The atrium was mildly dilated. - Technically adequate  study.  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 Hypertension   . Anxiety   . GERD (gastroesophageal reflux disease)   . Hypercholesteremia     Past Surgical History  Procedure Laterality Date  . No past surgeries    . Knee arthroscopy with medial menisectomy Left 04/30/2015    Procedure: LEFT KNEE ARTHROSCOPY WITH PARTIAL MEDIAL MENISECTOMY;  Surgeon: Darreld Mclean, MD;  Location: [facility];  Service: Orthopedics;  Laterality: Left;     Current Outpatient Prescriptions  Medication Sig Dispense Refill  . LORazepam (ATIVAN) 0.5 MG tablet Take 0.5 mg by mouth every 8 (eight) hours as needed for anxiety.    Marland Kitchen losartan (COZAAR) 100 MG tablet Take 100 mg by mouth daily.    . nortriptyline (PAMELOR) 25 MG capsule Take 25 mg by mouth at bedtime.    . ranitidine (ZANTAC) 150 MG tablet Take 150 mg by mouth daily.    . metoprolol succinate (TOPROL-XL) 100 MG 24 hr tablet Take 1 tablet (100 mg total) by mouth daily. 30 tablet 11   No current facility-administered medications for this visit.    Allergies:   Review of patient's allergies indicates no known allergies.    Social History:  The patient  reports that he quit smoking about 9 years ago. His smoking use included  Cigarettes. He has a 40 pack-year smoking history. He has never used smokeless tobacco. He reports that he drinks alcohol. He reports that he does not use illicit drugs.   Family History:  The patient's family history includes Asthma in his mother; Heart disease in his mother and sister. There is no history of Colon cancer or Stomach cancer.    ROS:  Please see the history of present illness.   Otherwise, review of systems are positive for none.   All other systems are reviewed and negative.    PHYSICAL EXAM: VS:  BP 142/92 mmHg  Pulse 98  Ht 6\' 1"  (1.854 m)  Wt 122.018 kg (269 lb)  BMI 35.50 kg/m2  SpO2 97% ,  BMI Body mass index is 35.5 kg/(m^2). Affect appropriate Healthy:  appears stated age HEENT: normal Neck supple with no adenopathy JVP normal no bruits no thyromegaly Lungs clear with no wheezing and good diaphragmatic motion Heart:  S1/S2 no murmur, no rub, gallop or click PMI normal Abdomen: benighn, BS positve, no tenderness, no AAA no bruit.  No HSM or HJR Distal pulses intact with no bruits No edema Neuro non-focal Skin warm and dry No muscular weakness    EKG:  4/11  Sinus tachycardia rate 115 with coupled aberrant PAC;s    Recent Labs: No results found for requested labs within last 365 days.    Lipid Panel No results found for: CHOL, TRIG, HDL, CHOLHDL, VLDL, LDLCALC, LDLDIRECT    Wt Readings from Last 3 Encounters:  06/03/16 122.018 kg (269 lb)  04/25/15 122.018 kg (269 lb)  04/11/15 120.385 kg (265 lb 6.4 oz)      Other studies Reviewed: Additional studies/ records that were reviewed today include: Primary care records and Epic notes.    ASSESSMENT AND PLAN:  1. Abnormal ECG:  PAC;s PCC resolved nonspecific ST changes low risk myovue 04/2015  2. PAC;s  Ambient high HR  Increase Toprol 100 mg daily  3. COPD  No active wheezing quit smoking 8 years ago no longer seeing Dr Sherene SiresWert 4. Ortho post Left TKR wearing brace doing well   Current medicines are reviewed at length with the patient today.  The patient does not have concerns regarding medicines.  The following changes have been made:  Stop diuretic start Toprol  Labs/ tests ordered today include:  Echo and Lexiscan Myovue   Orders Placed This Encounter  Procedures  . EKG 12-Lead     Disposition:   FU with me 6 months      Signed, Charlton HawsPeter Lowana Hable, MD  06/03/2016 8:49 AM    [facility] [address], [place], [place]  [address] Phone: [phone]; Fax: [phone]) [phone]

## 2016-06-03 ENCOUNTER — Encounter: Payer: Self-pay | Admitting: Cardiovascular Disease

## 2016-06-03 ENCOUNTER — Ambulatory Visit (INDEPENDENT_AMBULATORY_CARE_PROVIDER_SITE_OTHER): Payer: PRIVATE HEALTH INSURANCE | Admitting: Cardiovascular Disease

## 2016-06-03 VITALS — BP 142/92 | HR 98 | Ht 73.0 in | Wt 269.0 lb

## 2016-06-03 DIAGNOSIS — I10 Essential (primary) hypertension: Secondary | ICD-10-CM | POA: Diagnosis not present

## 2016-06-03 MED ORDER — METOPROLOL SUCCINATE ER 100 MG PO TB24: 100.0000 mg | ORAL_TABLET | Freq: Every day | ORAL | Status: DC

## 2016-06-03 NOTE — Patient Instructions (Signed)
Your physician wants you to follow-up in: 6 Months with Dr. Eden EmmsNishan. You will receive a reminder letter in the mail two months in advance. If you don't receive a letter, please call our office to schedule the follow-up appointment.  Your physician has recommended you make the following change in your medication:   Increase Toprol to 100 mg Daily  If you need a refill on your cardiac medications before your next appointment, please call your pharmacy.  Thank you for choosing [facility]!

## 2016-07-31 ENCOUNTER — Other Ambulatory Visit: Payer: Self-pay | Admitting: Cardiovascular Disease

## 2016-08-08 ENCOUNTER — Other Ambulatory Visit: Payer: Self-pay | Admitting: Cardiovascular Disease

## 2017-02-22 ENCOUNTER — Encounter: Payer: Self-pay | Admitting: Family Medicine

## 2017-02-22 ENCOUNTER — Ambulatory Visit (INDEPENDENT_AMBULATORY_CARE_PROVIDER_SITE_OTHER): Payer: BLUE CROSS/BLUE SHIELD | Admitting: Family Medicine

## 2017-02-22 VITALS — BP 124/80 | HR 76 | Temp 97.0°F | Ht 73.0 in | Wt 281.1 lb

## 2017-02-22 DIAGNOSIS — J209 Acute bronchitis, unspecified: Secondary | ICD-10-CM | POA: Diagnosis not present

## 2017-02-22 MED ORDER — PREDNISONE 20 MG PO TABS: ORAL_TABLET | ORAL | 0 refills | Status: DC

## 2017-02-22 MED ORDER — DOXYCYCLINE HYCLATE 100 MG PO TABS: 100.0000 mg | ORAL_TABLET | Freq: Two times a day (BID) | ORAL | 0 refills | Status: DC

## 2017-02-22 MED ORDER — ALBUTEROL SULFATE HFA 108 (90 BASE) MCG/ACT IN AERS: 2.0000 | INHALATION_SPRAY | Freq: Four times a day (QID) | RESPIRATORY_TRACT | 0 refills | Status: AC | PRN

## 2017-02-22 NOTE — Progress Notes (Signed)
BP 124/80   Pulse 76   Temp 97 F (36.1 C) (Oral)   Ht 6\' 1"  (1.854 m)   Wt 281 lb 2 oz (127.5 kg)   SpO2 97%   BMI 37.09 kg/m    Subjective:    Patient ID: Marvin Bender, male    DOB: Nov 14, 1956, 61 y.o.   MRN: [id number]  HPI: Marvin Bender is a 61 y.o. male presenting on 02/22/2017 for Chest congestion, wheezing (x 1 week )   HPI Chest congestion and wheezing for 1 week Patient has been having chest congestion and wheezing and coughing that's been going on for one week. The cough and chest congestion has been worsening over the past few days. He has been having a lot of coughing and wheezing especially at night. He says he does have a history of smoking in the distant past and smoked for about 40 years a pack per day. He quit at least 10 years ago. He denies any shortness of breath or wheezing normally but when he does get a cold like he has currently he does get wheezy and has issues with breathing. He denies any fevers or chills.  Relevant past medical, surgical, family and social history reviewed and updated as indicated. Interim medical history since our last visit reviewed. Allergies and medications reviewed and updated.  Review of Systems  Constitutional: Negative for chills and fever.  HENT: Positive for congestion, postnasal drip, rhinorrhea, sinus pressure, sneezing and sore throat. Negative for ear discharge, ear pain and voice change.   Eyes: Negative for pain, discharge, redness and visual disturbance.  Respiratory: Positive for cough and wheezing. Negative for shortness of breath.   Cardiovascular: Negative for chest pain and leg swelling.  Musculoskeletal: Negative for gait problem.  Skin: Negative for rash.  All other systems reviewed and are negative.   Per HPI unless specifically indicated above     Objective:    BP 124/80   Pulse 76   Temp 97 F (36.1 C) (Oral)   Ht 6\' 1"  (1.854 m)   Wt 281 lb 2 oz (127.5 kg)   SpO2 97%   BMI 37.09 kg/m   Wt  Readings from Last 3 Encounters:  02/22/17 281 lb 2 oz (127.5 kg)  06/03/16 269 lb (122 kg)  04/25/15 269 lb (122 kg)    Physical Exam  Constitutional: He is oriented to person, place, and time. He appears well-developed and well-nourished. No distress.  HENT:  Right Ear: Tympanic membrane, external ear and ear canal normal.  Left Ear: Tympanic membrane, external ear and ear canal normal.  Nose: Mucosal edema and rhinorrhea present. No sinus tenderness. No epistaxis. Right sinus exhibits maxillary sinus tenderness. Right sinus exhibits no frontal sinus tenderness. Left sinus exhibits maxillary sinus tenderness. Left sinus exhibits no frontal sinus tenderness.  Mouth/Throat: Uvula is midline and mucous membranes are normal. Posterior oropharyngeal edema and posterior oropharyngeal erythema present. No oropharyngeal exudate or tonsillar abscesses.  Eyes: Conjunctivae are normal. No scleral icterus.  Neck: Neck supple. No thyromegaly present.  Cardiovascular: Normal rate, regular rhythm, normal heart sounds and intact distal pulses.   No murmur heard. Pulmonary/Chest: Effort normal. No respiratory distress. He has wheezes. He has no rales.  Musculoskeletal: Normal range of motion. He exhibits no edema.  Lymphadenopathy:    He has no cervical adenopathy.  Neurological: He is alert and oriented to person, place, and time. Coordination normal.  Skin: Skin is warm and dry. No rash noted. He  is not diaphoretic.  Psychiatric: He has a normal mood and affect. His behavior is normal.  Nursing note and vitals reviewed.     Assessment & Plan:   Problem List Items Addressed This Visit    None    Visit Diagnoses    Acute bronchitis, unspecified organism    -  Primary   Possibly some underlying COPD, albuterol and prednisone and doxycycline   Relevant Medications   albuterol (PROVENTIL HFA;VENTOLIN HFA) 108 (90 Base) MCG/ACT inhaler   predniSONE (DELTASONE) 20 MG tablet   doxycycline  (VIBRA-TABS) 100 MG tablet       Follow up plan: Return if symptoms worsen or fail to improve.  Counseling provided for all of the vaccine components No orders of the defined types were placed in this encounter.   Arville Care, MD [facility] 02/22/2017, 10:49 AM

## 2017-04-05 ENCOUNTER — Ambulatory Visit (INDEPENDENT_AMBULATORY_CARE_PROVIDER_SITE_OTHER): Payer: BLUE CROSS/BLUE SHIELD | Admitting: Family

## 2017-04-05 ENCOUNTER — Encounter: Payer: Self-pay | Admitting: Family

## 2017-04-05 VITALS — BP 129/97 | HR 89 | Temp 97.4°F | Ht 73.0 in | Wt 282.0 lb

## 2017-04-05 DIAGNOSIS — J209 Acute bronchitis, unspecified: Secondary | ICD-10-CM

## 2017-04-05 MED ORDER — CETIRIZINE HCL 10 MG PO TABS: 10.0000 mg | ORAL_TABLET | Freq: Every day | ORAL | 11 refills | Status: DC

## 2017-04-05 MED ORDER — PREDNISONE 10 MG (21) PO TBPK: ORAL_TABLET | ORAL | 0 refills | Status: DC

## 2017-04-05 MED ORDER — LEVOFLOXACIN 500 MG PO TABS: 500.0000 mg | ORAL_TABLET | Freq: Every day | ORAL | 0 refills | Status: DC

## 2017-04-05 NOTE — Patient Instructions (Signed)

## 2017-04-05 NOTE — Progress Notes (Signed)
Subjective:    Patient ID: Marvin Bender, male    DOB: 1956/06/05, 61 y.o.   MRN: [id number]  Cough  This is a recurrent problem. The current episode started 1 to 4 weeks ago. The problem has been waxing and waning. The problem occurs every few minutes. The cough is productive of purulent sputum and productive of sputum. Associated symptoms include postnasal drip, shortness of breath and wheezing. Pertinent negatives include no chills, ear congestion, ear pain, fever, headaches, nasal congestion, rhinorrhea or sore throat. The symptoms are aggravated by lying down. He has tried rest and oral steroids (doxycyline) for the symptoms. The treatment provided mild relief. There is no history of asthma or COPD.      Review of Systems  Constitutional: Negative for chills and fever.  HENT: Positive for postnasal drip. Negative for ear pain, rhinorrhea and sore throat.   Respiratory: Positive for cough, shortness of breath and wheezing.   Neurological: Negative for headaches.  All other systems reviewed and are negative.      Objective:   Physical Exam  Constitutional: He is oriented to person, place, and time. He appears well-developed and well-nourished. No distress.  HENT:  Head: Normocephalic.  Right Ear: External ear normal.  Left Ear: External ear normal.  Nose: Mucosal edema and rhinorrhea present.  Mouth/Throat: Posterior oropharyngeal edema and posterior oropharyngeal erythema present.  Eyes: Pupils are equal, round, and reactive to light. Right eye exhibits no discharge. Left eye exhibits no discharge.  Neck: Normal range of motion. Neck supple. No thyromegaly present.  Cardiovascular: Normal rate, regular rhythm, normal heart sounds and intact distal pulses.   No murmur heard. Pulmonary/Chest: Effort normal. No respiratory distress. He has wheezes. He has rhonchi.  Abdominal: Soft. Bowel sounds are normal. He exhibits no distension. There is no tenderness.  Musculoskeletal: Normal  range of motion. He exhibits no edema or tenderness.  Neurological: He is alert and oriented to person, place, and time. He has normal reflexes. No cranial nerve deficit.  Skin: Skin is warm and dry. No rash noted. No erythema.  Psychiatric: He has a normal mood and affect. His behavior is normal. Judgment and thought content normal.  Vitals reviewed.    BP (!) 129/97   Pulse 89   Temp 97.4 F (36.3 C) (Oral)   Ht  (1.854 m)   Wt 282 lb (127.9 kg)   BMI 37.21 kg/m       Assessment & Plan:  1. Acute bronchitis, unspecified organism - Take meds as prescribed - Use a cool mist humidifier  -Use saline nose sprays frequently -Saline irrigations of the nose can be very helpful if done frequently.  * 4X daily for 1 week*  * Use of a nettie pot can be helpful with this. Follow directions with this* -Force fluids -For any cough or congestion  Use plain Mucinex- regular strength or max strength is fine   * Children- consult with Pharmacist for dosing -For fever or aces or pains- take tylenol or ibuprofen appropriate for age and weight.  * for fevers greater than 101 orally you may alternate ibuprofen and tylenol every  3 hours. -Throat lozenges if help -RTO prn  -Albuterol as needed for SOB or wheezing  - levofloxacin (LEVAQUIN) 500 MG tablet; Take 1 tablet (500 mg total) by mouth daily.  Dispense: 7 tablet; Refill: 0 - predniSONE (STERAPRED UNI-PAK 21 TAB) 10 MG (21) TBPK tablet; Use as directed  Dispense: 21 tablet; Refill: 0 - cetirizine (  ZYRTEC) 10 MG tablet; Take 1 tablet (10 mg total) by mouth daily.  Dispense: 30 tablet; Refill: 11   Jannifer Rodney, FNP

## 2017-05-20 ENCOUNTER — Ambulatory Visit (INDEPENDENT_AMBULATORY_CARE_PROVIDER_SITE_OTHER): Payer: BLUE CROSS/BLUE SHIELD | Admitting: Cardiology

## 2017-05-20 ENCOUNTER — Encounter: Payer: Self-pay | Admitting: Cardiology

## 2017-05-20 VITALS — BP 134/98 | HR 82 | Ht 73.0 in | Wt 280.0 lb

## 2017-05-20 DIAGNOSIS — E669 Obesity, unspecified: Secondary | ICD-10-CM | POA: Diagnosis not present

## 2017-05-20 DIAGNOSIS — I10 Essential (primary) hypertension: Secondary | ICD-10-CM

## 2017-05-20 DIAGNOSIS — R0602 Shortness of breath: Secondary | ICD-10-CM

## 2017-05-20 MED ORDER — CHLORTHALIDONE 25 MG PO TABS: 25.0000 mg | ORAL_TABLET | Freq: Every day | ORAL | 3 refills | Status: DC

## 2017-05-20 MED ORDER — METOPROLOL SUCCINATE ER 50 MG PO TB24: 50.0000 mg | ORAL_TABLET | Freq: Every day | ORAL | 3 refills | Status: DC

## 2017-05-20 NOTE — Assessment & Plan Note (Signed)
Fair control.

## 2017-05-20 NOTE — Assessment & Plan Note (Signed)
Seen in the office today with complaints of DOE since Feb [place] in Sept 2014 and by Dr Sherene SiresWert in Oct 2014 with a cardiopulmonary stress test.

## 2017-05-20 NOTE — Progress Notes (Signed)
05/20/2017 Marvin Bender   23-Apr-1956  [id number]  Primary Physician Lelan Pons, PA Primary Cardiologist: Dr Eden Emms-  HPI:  61 y.o. male who was seen seen in the past with HTN and anxiety with 40 lb weight gain. The pt had cardiopulmonary stress test by Dr Sherene Sires in 2014 that was negative for circulatory limitation and most consistent with deconditioning. Echo  04/17/15 showed normal LVF and diastolic function.  CT with pulmonary nodules no ILD  Last seen by Dr Sherene Sires for dyspnea in 2014 thought obesity was major issue.  He last saw Dr Eden Emms June 2017. He is in the office today as a self referral for dyspnea. The pt says he has noted increased SOB since Feb this year. He denies chest pain or palpitations. He says he is SOB and wheezes when he bends over. He denies snoring or daytime fatigue. 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 He used to smoke 2ppd but quit 10 yrs ago.    Current Outpatient Prescriptions  Medication Sig Dispense Refill  . albuterol (PROVENTIL HFA;VENTOLIN HFA) 108 (90 Base) MCG/ACT inhaler Inhale 2 puffs into the lungs every 6 (six) hours as needed for wheezing or shortness of breath. 1 Inhaler 0  . cetirizine (ZYRTEC) 10 MG tablet Take 1 tablet (10 mg total) by mouth daily. 30 tablet 11  . LORazepam (ATIVAN) 0.5 MG tablet Take 0.5 mg by mouth every 8 (eight) hours as needed for anxiety.    Marland Kitchen losartan (COZAAR) 100 MG tablet Take 100 mg by mouth daily.    . nortriptyline (PAMELOR) 25 MG capsule Take 25 mg by mouth at bedtime.    . ranitidine (ZANTAC) 150 MG tablet Take 150 mg by mouth daily.    . chlorthalidone (HYGROTON) 25 MG tablet Take 1 tablet (25 mg total) by mouth daily. 90 tablet 3  . metoprolol succinate (TOPROL-XL) 50 MG 24 hr tablet Take 1 tablet (50 mg total) by mouth daily. Take with or immediately following a meal. 90 tablet 3   No current facility-administered medications for this visit.      Allergies  Allergen Reactions  . Duloxetine Diarrhea and Other (See Comments)    Abdominal pain, Nerveousness, Headache    Past Medical History:  Diagnosis Date  . Anxiety   . GERD (gastroesophageal reflux disease)   . Hypercholesteremia   . Hypertension     Social History   Social History  . Marital status: Married    Spouse name: N/A  . Number of children: N/A  . Years of education: N/A   Occupational History  . Retired    Social History Main Topics  . Smoking status: Former Smoker    Packs/day: 1.00    Years: 40.00    Types: Cigarettes    Quit date: 04/12/2007  . Smokeless tobacco: Never Used  . Alcohol use Yes     Comment: occasional beer   . Drug use: No  . Sexual activity: No   Other Topics Concern  . Not on file   Social History Narrative  . No narrative on file     Family History  Problem Relation Age of Onset  . Asthma Mother   . Heart disease Mother   . Heart disease Sister   . Colon cancer Neg Hx   . Stomach cancer Neg Hx      Review of Systems: General: negative for chills, fever, night sweats  or weight changes.  Cardiovascular: negative for chest pain, edema, orthopnea, palpitations, paroxysmal nocturnal dyspnea  Dermatological: negative for rash Respiratory: negative for cough or wheezing Urologic: negative for hematuria Abdominal: negative for nausea, vomiting, diarrhea, bright red blood per rectum, melena, or hematemesis Neurologic: negative for visual changes, syncope, or dizziness All other systems reviewed and are otherwise negative except as noted above.    Blood pressure (!) 134/98, pulse 82, height 6\' 1"  (1.854 m), weight 280 lb (127 kg), SpO2 96 %.  General appearance: alert, cooperative, no distress and moderately obese Neck: no carotid bruit and no JVD Lungs: end expiratory wheezing  Heart: regular rate and rhythm Abdomen: obese, non tender, no HSM Extremities: no edema Pulses: 2+ and symmetric Skin: Skin color,  texture, turgor normal. No rashes or lesions Neurologic: Grossly normal  EKG NSR  ASSESSMENT AND PLAN:   DYSPNEA Seen in the office today with complaints of DOE since Feb [place]s in Sept 2014 and by Dr Sherene SiresWert in Oct 2014 with a cardiopulmonary stress test.   Essential hypertension Fair control  Obesity (BMI 30-39.9) BMI 36   PLAN  I suggested we cut his Toprol back to 50 mg daily and add chlorthalidone 25 mg. I also ordered an echo. He'll need a f/u with a cardiologist after this. If no obvious cardiac cause would refer back to pulmonary.   Corine ShelterLuke Lizza Huffaker PA-C 05/20/2017 3:46 PM

## 2017-05-20 NOTE — Patient Instructions (Signed)
Your physician recommends that you schedule a follow-up appointment in:  In 2 weeks   Your physician has requested that you have an echocardiogram. Echocardiography is a painless test that uses sound waves to create images of your heart. It provides your doctor with information about the size and shape of your heart and how well your heart's chambers and valves are working. This procedure takes approximately one hour. There are no restrictions for this procedure.  DECREASE Toprol XL to 50 mg daily  START Chlorthalidone 25 mg daily    Thank you for choosing [facility] !

## 2017-05-20 NOTE — Assessment & Plan Note (Signed)
BMI 36 

## 2017-05-28 ENCOUNTER — Ambulatory Visit (HOSPITAL_COMMUNITY)
Admission: RE | Admit: 2017-05-28 | Discharge: 2017-05-28 | Disposition: A | Discharge status: Home / Self Care | Payer: BLUE CROSS/BLUE SHIELD | Source: Ambulatory Visit | Attending: Cardiology | Admitting: Cardiology

## 2017-05-28 DIAGNOSIS — R0602 Shortness of breath: Secondary | ICD-10-CM | POA: Diagnosis not present

## 2017-05-28 LAB — ECHOCARDIOGRAM COMPLETE
AO mean calculated velocity dopler: 82.4 cm/s
AV Area VTI index: 0.97 cm2/m2
AV Area VTI: 2.52 cm2
AV Area mean vel: 2.63 cm2
AV Mean grad: 3 mmHg
AV Peak grad: 6 mmHg
AV VEL mean LVOT/AV: 0.84
AV area mean vel ind: 1.01 cm2/m2
AV peak Index: 0.97
AV pk vel: 126 cm/s
AV vel: 2.52
Ao pk vel: 0.8 m/s
E decel time: 222 msec
E/e' ratio: 8.82
FS: 33 % (ref 28–44)
IVS/LV PW RATIO, ED: 0.94
LA ID, A-P, ES: 36 mm
LA diam end sys: 36 mm
LA diam index: 1.38 cm/m2
LA vol A4C: 52.5 ml
LA vol index: 19.8 mL/m2
LA vol: 51.6 mL
LV E/e' medial: 8.82
LV E/e'average: 8.82
LV PW d: 11 mm — AB (ref 0.6–1.1)
LV dias vol index: 28 mL/m2
LV dias vol: 72 mL (ref 62–150)
LV e' LATERAL: 9.14 cm/s
LV sys vol index: 12 mL/m2
LV sys vol: 30 mL
LVOT SV: 72 mL
LVOT VTI: 22.8 cm
LVOT area: 3.14 cm2
LVOT diameter: 20 mm
LVOT peak VTI: 0.8 cm
LVOT peak grad rest: 4 mmHg
LVOT peak vel: 101 cm/s
Lateral S' vel: 11.1 cm/s
MV Dec: 222
MV Peak grad: 3 mmHg
MV pk A vel: 83.6 m/s
MV pk E vel: 80.6 m/s
Simpson's disk: 58
Stroke v: 42 ml
TAPSE: 22.6 mm
TDI e' lateral: 9.14
TDI e' medial: 6.42
VTI: 28.4 cm
Valve area index: 0.97
Valve area: 2.52 cm2

## 2017-05-28 NOTE — Progress Notes (Signed)
*  PRELIMINARY RESULTS* Echocardiogram 2D Echocardiogram has been performed.  Stacey DrainWhite, Myrian Botello J 05/28/2017, 9:02 AM

## 2017-06-01 ENCOUNTER — Telehealth: Payer: Self-pay | Admitting: Cardiovascular Disease

## 2017-06-01 NOTE — Telephone Encounter (Signed)
Informed pt of echo results. He voiced understanding.

## 2017-06-01 NOTE — Telephone Encounter (Signed)
Results of Echo / tg  °

## 2017-06-06 NOTE — Progress Notes (Signed)
06/07/2017 Pamella PertRandy A Toda   [birth year]  [id number]  Primary Physician Lelan PonsPasquale, Gina, PA Primary Cardiologist: Dr Eden EmmsNishan-  HPI:  61 y.o.  male who was seen seen in the past with HTN and anxiety with 40 lb weight gain. The pt had cardiopulmonary stress test by Dr Sherene SiresWert in 2014 that was negative for circulatory limitation and most consistent with deconditioning. Echo  04/17/15 showed normal LVF and diastolic function.  CT with pulmonary nodules no ILD  Last seen by Dr Sherene SiresWert for dyspnea in 2014 thought obesity was major issue.  He last saw me June 2017. Seen by PA 05/20/17 for dysonea Beta blocker lowered and diuretic added. Echo ordered and reviewed  Date 05/28/17  EF 55-60%  Grade one diastolic Mild LVH No pulmonary HTN No significant valve disease   Myovue done 04/17/15 mild reversible inferoseptal wall defect ? Artifact EF 71%  Low risk   Complained of  increased SOB since Feb this year. He denies chest pain or palpitations. He says he is SOB and wheezes when he bends over. He denies snoring or daytime fatigue. 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 He used to smoke 2ppd but quit 10 yrs ago.   He is still complaining of phlegm and bronchitic symptoms. History of asbestosis and lung nodules    Current Outpatient Prescriptions  Medication Sig Dispense Refill  . albuterol (PROVENTIL HFA;VENTOLIN HFA) 108 (90 Base) MCG/ACT inhaler Inhale 2 puffs into the lungs every 6 (six) hours as needed for wheezing or shortness of breath. 1 Inhaler 0  . cetirizine (ZYRTEC) 10 MG tablet Take 1 tablet (10 mg total) by mouth daily. 30 tablet 11  . chlorthalidone (HYGROTON) 25 MG tablet Take 1 tablet (25 mg total) by mouth daily. 90 tablet 3  . LORazepam (ATIVAN) 0.5 MG tablet Take 0.5 mg by mouth every 8 (eight) hours as needed for anxiety.    Marland Kitchen. losartan (COZAAR) 100 MG tablet Take 100 mg by mouth daily.    . metoprolol succinate (TOPROL-XL) 50 MG  24 hr tablet Take 1 tablet (50 mg total) by mouth daily. Take with or immediately following a meal. 90 tablet 3  . nortriptyline (PAMELOR) 25 MG capsule Take 25 mg by mouth at bedtime.    . ranitidine (ZANTAC) 150 MG tablet Take 150 mg by mouth daily.     No current facility-administered medications for this visit.     Allergies  Allergen Reactions  . Duloxetine Diarrhea and Other (See Comments)    Abdominal pain, Nerveousness, Headache    Past Medical History:  Diagnosis Date  . Anxiety   . GERD (gastroesophageal reflux disease)   . Hypercholesteremia   . Hypertension     Social History   Social History  . Marital status: Married    Spouse name: N/A  . Number of children: N/A  . Years of education: N/A   Occupational History  . Retired    Social History Main Topics  . Smoking status: Former Smoker    Packs/day: 1.00    Years: 40.00    Types: Cigarettes    Quit date: 04/12/2007  . Smokeless tobacco: Never Used  . Alcohol use Yes     Comment: occasional beer   . Drug use: No  . Sexual activity: No   Other Topics Concern  . Not on file   Social History Narrative  . No narrative on file  Family History  Problem Relation Age of Onset  . Asthma Mother   . Heart disease Mother   . Heart disease Sister   . Colon cancer Neg Hx   . Stomach cancer Neg Hx      Review of Systems: General: negative for chills, fever, night sweats or weight changes.  Cardiovascular: negative for chest pain, edema, orthopnea, palpitations, paroxysmal nocturnal dyspnea  Dermatological: negative for rash Respiratory: negative for cough or wheezing Urologic: negative for hematuria Abdominal: negative for nausea, vomiting, diarrhea, bright red blood per rectum, melena, or hematemesis Neurologic: negative for visual changes, syncope, or dizziness All other systems reviewed and are otherwise negative except as noted above.    Blood pressure (!) 144/90, pulse 89, height 6\' 1"   (1.854 m), weight 126.1 kg (278 lb), SpO2 94 %.  Affect appropriate Obese male  HEENT: normal Neck supple with no adenopathy JVP normal no bruits no thyromegaly Lungs rhonchi no  wheezing and good diaphragmatic motion Heart:  S1/S2 no murmur, no rub, gallop or click PMI normal Abdomen: benighn, BS positve, no tenderness, no AAA no bruit.  No HSM or HJR Distal pulses intact with no bruits No edema Neuro non-focal Skin warm and dry No muscular weakness   EKG NSR rate 70 RSR; otherwise normal 05/20/17   ASSESSMENT AND PLAN:   1. Dyspnea: does not appear cardiac f/u primary for PfTls will order high res lung CT to assess lung nodule History, asbestosis and ILD Exam consistent with COPD and bronchitis  2. HTN: Well controlled.  Continue current medications and low sodium Dash type diet.   3. GERD continue zantac ? Component of reflux causing cough 4. Obesity discussed need to lose weight as contributor to dyspnea   F/U in a year PRN  Charlton Haws, MD

## 2017-06-07 ENCOUNTER — Encounter: Payer: Self-pay | Admitting: Cardiovascular Disease

## 2017-06-07 ENCOUNTER — Ambulatory Visit (INDEPENDENT_AMBULATORY_CARE_PROVIDER_SITE_OTHER): Payer: BLUE CROSS/BLUE SHIELD | Admitting: Cardiovascular Disease

## 2017-06-07 VITALS — BP 144/90 | HR 89 | Ht 73.0 in | Wt 278.0 lb

## 2017-06-07 DIAGNOSIS — R0602 Shortness of breath: Secondary | ICD-10-CM | POA: Diagnosis not present

## 2017-06-07 DIAGNOSIS — R0600 Dyspnea, unspecified: Secondary | ICD-10-CM

## 2017-06-07 NOTE — Addendum Note (Signed)
Addended by: Abelino DerrickMCGHEE, Fransheska Willingham R on: 06/07/2017 03:38 PM   Modules accepted: Orders

## 2017-06-07 NOTE — Addendum Note (Signed)
Addended by: Abelino DerrickMCGHEE, Jakyri Brunkhorst R on: 06/07/2017 03:45 PM   Modules accepted: Orders

## 2017-06-07 NOTE — Patient Instructions (Signed)
Medication Instructions:  Your physician recommends that you continue on your current medications as directed. Please refer to the Current Medication list given to you today.   Labwork: NONE  Testing/Procedures: Non-Cardiac CT scanning, (CAT scanning), is a noninvasive, special x-ray that produces cross-sectional images of the body using x-rays and a computer. CT scans help physicians diagnose and treat medical conditions. For some CT exams, a contrast material is used to enhance visibility in the area of the body being studied. CT scans provide greater clarity and reveal more details than regular x-ray exams.    Follow-Up: Your physician recommends that you schedule a follow-up appointment in: TO BE DETERMINED    Any Other Special Instructions Will Be Listed Below (If Applicable).     If you need a refill on your cardiac medications before your next appointment, please call your pharmacy.

## 2017-06-16 ENCOUNTER — Ambulatory Visit (HOSPITAL_COMMUNITY)
Admission: RE | Admit: 2017-06-16 | Discharge: 2017-06-16 | Disposition: A | Discharge status: Home / Self Care | Payer: BLUE CROSS/BLUE SHIELD | Source: Ambulatory Visit | Attending: Cardiovascular Disease | Admitting: Cardiovascular Disease

## 2017-06-16 DIAGNOSIS — R0602 Shortness of breath: Secondary | ICD-10-CM | POA: Insufficient documentation

## 2017-06-16 DIAGNOSIS — I2510 Atherosclerotic heart disease of native coronary artery without angina pectoris: Secondary | ICD-10-CM | POA: Insufficient documentation

## 2017-06-17 ENCOUNTER — Telehealth: Payer: Self-pay | Admitting: *Deleted

## 2017-06-17 NOTE — Telephone Encounter (Signed)
Patient informed. 

## 2017-06-17 NOTE — Telephone Encounter (Signed)
Result Notes   Notes recorded by Wendall StadeNishan, Peter C, MD on 06/17/2017 at 12:17 PM EDT CT ok no bad lung disease

## 2017-06-18 DIAGNOSIS — R931 Abnormal findings on diagnostic imaging of heart and coronary circulation: Secondary | ICD-10-CM

## 2017-06-18 MED ORDER — ATORVASTATIN CALCIUM 20 MG PO TABS: 20.0000 mg | ORAL_TABLET | Freq: Every day | ORAL | 11 refills | Status: DC

## 2017-06-18 NOTE — Progress Notes (Signed)
This encounter was created in error - please disregard.

## 2017-07-14 DIAGNOSIS — W109XXA Fall (on) (from) unspecified stairs and steps, initial encounter: Secondary | ICD-10-CM | POA: Diagnosis not present

## 2017-07-14 DIAGNOSIS — S0990XA Unspecified injury of head, initial encounter: Secondary | ICD-10-CM | POA: Diagnosis not present

## 2017-07-14 DIAGNOSIS — I10 Essential (primary) hypertension: Secondary | ICD-10-CM | POA: Diagnosis not present

## 2017-07-14 DIAGNOSIS — S098XXA Other specified injuries of head, initial encounter: Secondary | ICD-10-CM | POA: Diagnosis not present

## 2017-07-14 DIAGNOSIS — Y9301 Activity, walking, marching and hiking: Secondary | ICD-10-CM | POA: Diagnosis not present

## 2017-07-14 DIAGNOSIS — R410 Disorientation, unspecified: Secondary | ICD-10-CM | POA: Diagnosis not present

## 2017-07-14 DIAGNOSIS — S0101XA Laceration without foreign body of scalp, initial encounter: Secondary | ICD-10-CM | POA: Diagnosis not present

## 2017-07-14 DIAGNOSIS — R51 Headache: Secondary | ICD-10-CM | POA: Diagnosis not present

## 2017-07-14 DIAGNOSIS — Z23 Encounter for immunization: Secondary | ICD-10-CM | POA: Diagnosis not present

## 2017-07-14 DIAGNOSIS — M542 Cervicalgia: Secondary | ICD-10-CM | POA: Diagnosis not present

## 2017-07-19 ENCOUNTER — Telehealth: Payer: Self-pay | Admitting: Family Medicine

## 2017-07-19 NOTE — Telephone Encounter (Signed)
appt scheduled Pt notified 

## 2017-07-22 ENCOUNTER — Ambulatory Visit (INDEPENDENT_AMBULATORY_CARE_PROVIDER_SITE_OTHER): Payer: BLUE CROSS/BLUE SHIELD | Admitting: Family Medicine

## 2017-07-22 ENCOUNTER — Encounter: Payer: Self-pay | Admitting: Family Medicine

## 2017-07-22 VITALS — BP 149/107 | HR 104 | Temp 98.7°F | Ht 73.0 in | Wt 279.0 lb

## 2017-07-22 DIAGNOSIS — S0101XA Laceration without foreign body of scalp, initial encounter: Secondary | ICD-10-CM

## 2017-07-22 DIAGNOSIS — S0101XD Laceration without foreign body of scalp, subsequent encounter: Secondary | ICD-10-CM

## 2017-07-22 NOTE — Progress Notes (Signed)
   BP (!) 152/113   Pulse (!) 104   Temp 98.7 F (37.1 C) (Oral)   Ht 6\' 1"  (1.854 m)   Wt 279 lb (126.6 kg)   BMI 36.81 kg/m    Subjective:    Patient ID: Marvin Bender, male    DOB: 1956-08-21, 61 y.o.   MRN: [id number]  HPI: Marvin Bender is a 61 y.o. male presenting on 07/22/2017 for Suture / Staple Removal (laceration to head after a fall on 07/15/17)   HPI Scalp laceration/staple removal [place] the [place] of his head on the stairs and sustained a scalp laceration on 07/15/2017. He is coming in today for a follow-up for staple removal. They placed 5 staples in the back of his scalp. He denies having any drainage or bleeding since that time. He does feel like the staples are pulling on the back of his scalp and are hurting but denies any pain anywhere else. He says that he's been acting walking normally since the initial evaluation. [facility].  Relevant past medical, surgical, family and social history reviewed and updated as indicated. Interim medical history since our last visit reviewed. Allergies and medications reviewed and updated.  Review of Systems  Constitutional: Negative for chills and fever.  Respiratory: Negative for shortness of breath and wheezing.   Cardiovascular: Negative for chest pain and leg swelling.  Musculoskeletal: Negative for back pain and gait problem.  Skin: Positive for wound. Negative for rash.  All other systems reviewed and are negative.   Per HPI unless specifically indicated above        Objective:    BP (!) 152/113   Pulse (!) 104   Temp 98.7 F (37.1 C) (Oral)   Ht 6\' 1"  (1.854 m)   Wt 279 lb (126.6 kg)   BMI 36.81 kg/m   Wt Readings from Last 3 Encounters:  07/22/17 279 lb (126.6 kg)  06/07/17 278 lb (126.1 kg)  05/20/17 280 lb (127 kg)    Physical Exam  Constitutional: He is oriented to person, place, and time. He  appears well-developed and well-nourished. No distress.  Eyes: Conjunctivae are normal. No scleral icterus.  Musculoskeletal: Normal range of motion.  Neurological: He is alert and oriented to person, place, and time. Coordination normal.  Skin: Skin is warm and dry. Laceration (6 cm horizontal laceration on the occipital region of his scalp. Has 5 staples in there and appears to be healing well together) noted. No rash noted. He is not diaphoretic.  Psychiatric: He has a normal mood and affect. His behavior is normal.  Nursing note and vitals reviewed.  Removed 5 staples, patient tolerated well, no drainage or erythema or warmth.    Assessment & Plan:   Problem List Items Addressed This Visit    None    Visit Diagnoses    Scalp laceration, subsequent encounter    -  Primary   Removed 5 staples from the back of scalp, laceration extends about 6 cm bilaterally in the occipital region, appears to be healed       Follow up plan: Return if symptoms worsen or fail to improve.  Counseling provided for all of the vaccine components No orders of the defined types were placed in this encounter.   Arville CareJoshua Latrisa Hellums, MD [facility] 07/22/2017, 1:26 PM

## 2017-09-28 DIAGNOSIS — F411 Generalized anxiety disorder: Secondary | ICD-10-CM | POA: Diagnosis not present

## 2017-09-28 DIAGNOSIS — Z0000 Encounter for general adult medical examination without abnormal findings: Secondary | ICD-10-CM | POA: Diagnosis not present

## 2017-09-28 DIAGNOSIS — I10 Essential (primary) hypertension: Secondary | ICD-10-CM | POA: Diagnosis not present

## 2017-09-28 DIAGNOSIS — E7800 Pure hypercholesterolemia, unspecified: Secondary | ICD-10-CM | POA: Diagnosis not present

## 2017-10-03 DIAGNOSIS — R9431 Abnormal electrocardiogram [ECG] [EKG]: Secondary | ICD-10-CM | POA: Diagnosis not present

## 2017-10-05 DIAGNOSIS — E876 Hypokalemia: Secondary | ICD-10-CM | POA: Diagnosis not present

## 2017-12-02 ENCOUNTER — Other Ambulatory Visit: Payer: Self-pay | Admitting: Cardiology

## 2017-12-27 ENCOUNTER — Telehealth: Payer: Self-pay | Admitting: Family Medicine

## 2017-12-27 NOTE — Telephone Encounter (Signed)
Pt c/o sore throat and body aches with cough x1 day. Pt has old Amox wants tod know if he can take with the medication he is on. Please advise

## 2017-12-27 NOTE — Telephone Encounter (Signed)
I informed patient I recommend he not take an old antibiotic.  If he feels he needs an antibiotic, he should be seen.  Patient does not want to be seen and is calling the pharmacy to check for interactions.

## 2017-12-27 NOTE — Progress Notes (Signed)
Subjective: CC: cough PCP: Lelan PonsPasquale, Gina, PA BMW:UXLKGHPI:Sreekar A Garn is a 62 y.o. male presenting to clinic today for:  1. Cough/ sore throat Patient reports mild intermittent dry cough with associated pain in his throat that started 2-3 days ago.  He notes that sore throat is only present if he coughs hard.  Denies hemoptysis.  He reports that symptoms are actually improving without intervention but he is here because his wife made him come.  Denies congestion, rhinorrhea, sinus pressure, headache, SOB, dizziness, rash, nausea, vomiting, diarrhea, fevers, chills, myalgia, sick contacts, recent travel.  Denies early satiety, unplanned weight loss, night sweats, fevers, chills.  Reports a history of tobacco use/ exposure.  He reports he stopped smoking about 8 years ago when his wife was diagnosed with colon cancer.    ROS: Per HPI  Allergies  Allergen Reactions  . Duloxetine Diarrhea and Other (See Comments)    Abdominal pain, Nerveousness, Headache   Past Medical History:  Diagnosis Date  . Anxiety   . GERD (gastroesophageal reflux disease)   . Hypercholesteremia   . Hypertension     Current Outpatient Medications:  .  albuterol (PROVENTIL HFA;VENTOLIN HFA) 108 (90 Base) MCG/ACT inhaler, Inhale 2 puffs into the lungs every 6 (six) hours as needed for wheezing or shortness of breath., Disp: 1 Inhaler, Rfl: 0 .  cetirizine (ZYRTEC) 10 MG tablet, Take 1 tablet (10 mg total) by mouth daily., Disp: 30 tablet, Rfl: 11 .  chlorthalidone (HYGROTON) 25 MG tablet, Take 1 tablet (25 mg total) by mouth daily., Disp: 90 tablet, Rfl: 3 .  LORazepam (ATIVAN) 0.5 MG tablet, Take 0.5 mg by mouth every 8 (eight) hours as needed for anxiety., Disp: , Rfl:  .  losartan (COZAAR) 100 MG tablet, Take 100 mg by mouth daily., Disp: , Rfl:  .  metoprolol succinate (TOPROL-XL) 50 MG 24 hr tablet, TAKE 1 TABLET (50 MG TOTAL) BY MOUTH DAILY. TAKE WITH OR IMMEDIATELY FOLLOWING A MEAL., Disp: 90 tablet, Rfl: 3 .   nortriptyline (PAMELOR) 25 MG capsule, Take 25 mg by mouth at bedtime., Disp: , Rfl:  .  ranitidine (ZANTAC) 150 MG tablet, Take 150 mg by mouth daily., Disp: , Rfl:  Social History   Socioeconomic History  . Marital status: Married    Spouse name: Not on file  . Number of children: Not on file  . Years of education: Not on file  . Highest education level: Not on file  Social Needs  . Financial resource strain: Not on file  . Food insecurity - worry: Not on file  . Food insecurity - inability: Not on file  . Transportation needs - medical: Not on file  . Transportation needs - non-medical: Not on file  Occupational History  . Occupation: Retired  Tobacco Use  . Smoking status: Former Smoker    Packs/day: 1.00    Years: 40.00    Pack years: 40.00    Types: Cigarettes    Last attempt to quit: 04/12/2007    Years since quitting: 10.7  . Smokeless tobacco: Never Used  Substance and Sexual Activity  . Alcohol use: Yes    Comment: occasional beer   . Drug use: No  . Sexual activity: No    Birth control/protection: None  Other Topics Concern  . Not on file  Social History Narrative  . Not on file   Family History  Problem Relation Age of Onset  . Asthma Mother   . Heart disease Mother   .  Heart disease Sister   . Colon cancer Neg Hx   . Stomach cancer Neg Hx     Objective: Office vital signs reviewed. BP 135/90   Pulse (!) 114   Temp 97.6 F (36.4 C) (Oral)   Ht 6\' 1"  (1.854 m)   Wt 277 lb (125.6 kg)   BMI 36.55 kg/m   Physical Examination:  General: Awake, alert, disheveled appearing, nontoxic, No acute distress HEENT: Normal    Neck: No masses palpated. Mildly enlarged anterior cervical lymph node on the right.    Eyes: PERRLA, extraocular membranes intact, sclera white    Throat: moist mucus membranes, mild oropharyngeal erythema, no tonsillar exudate.  Airway is patent  Assessment/ Plan: 62 y.o. male   Pharyngitis, unspecified etiology  Likely from a  viral URI that is resolving.  He is afebrile and well-appearing.  Physical exam with mild oropharyngeal erythema.  Supportive care recommended.  I discussed with him that if he has throat pain that lasts longer than expected, worsens, he develops the inability to drink, he should seek medical attention.   Raliegh Ip, DO [facility] [phone]

## 2017-12-28 ENCOUNTER — Encounter: Payer: Self-pay | Admitting: Family Medicine

## 2017-12-28 ENCOUNTER — Ambulatory Visit: Payer: BLUE CROSS/BLUE SHIELD | Admitting: Family Medicine

## 2017-12-28 VITALS — BP 135/90 | HR 114 | Temp 97.6°F | Ht 73.0 in | Wt 277.0 lb

## 2017-12-28 DIAGNOSIS — J029 Acute pharyngitis, unspecified: Secondary | ICD-10-CM

## 2017-12-28 NOTE — Patient Instructions (Signed)
As we discussed, this may be coming from a viral upper respiratory infection.  However, the fact that it is resolving is reassuring.  Again, if you develop significant changes in voice that last longer than 2 weeks, difficulty swallowing, sensation of foreign body in your throat, unplanned weight loss, night sweats or significant fatigue, please seek immediate medical attention.   Pharyngitis Pharyngitis is a sore throat (pharynx). There is redness, pain, and swelling of your throat. Follow these instructions at home:  Drink enough fluids to keep your pee (urine) clear or pale yellow.  Only take medicine as told by your doctor. ? You may get sick again if you do not take medicine as told. Finish your medicines, even if you start to feel better. ? Do not take aspirin.  Rest.  Rinse your mouth (gargle) with salt water ( tsp of salt per 1 qt of water) every 1-2 hours. This will help the pain.  If you are not at risk for choking, you can suck on hard candy or sore throat lozenges. Contact a doctor if:  You have large, tender lumps on your neck.  You have a rash.  You cough up green, yellow-brown, or bloody spit. Get help right away if:  You have a stiff neck.  You drool or cannot swallow liquids.  You throw up (vomit) or are not able to keep medicine or liquids down.  You have very bad pain that does not go away with medicine.  You have problems breathing (not from a stuffy nose). This information is not intended to replace advice given to you by your health care provider. Make sure you discuss any questions you have with your health care provider. Document Released: 05/25/2008 Document Revised: 05/14/2016 Document Reviewed: 08/14/2013 Elsevier Interactive Patient Education  2017 [employer]

## 2018-01-31 ENCOUNTER — Other Ambulatory Visit: Payer: Self-pay | Admitting: Family

## 2018-01-31 DIAGNOSIS — J209 Acute bronchitis, unspecified: Secondary | ICD-10-CM

## 2018-04-26 ENCOUNTER — Other Ambulatory Visit: Payer: Self-pay | Admitting: Family

## 2018-04-26 DIAGNOSIS — J209 Acute bronchitis, unspecified: Secondary | ICD-10-CM

## 2018-06-05 ENCOUNTER — Other Ambulatory Visit: Payer: Self-pay | Admitting: Cardiology

## 2018-10-23 ENCOUNTER — Other Ambulatory Visit: Payer: Self-pay | Admitting: Family

## 2018-10-23 DIAGNOSIS — J209 Acute bronchitis, unspecified: Secondary | ICD-10-CM

## 2019-02-13 ENCOUNTER — Encounter: Payer: Self-pay | Admitting: Family

## 2019-02-13 ENCOUNTER — Ambulatory Visit: Payer: BLUE CROSS/BLUE SHIELD | Admitting: Family

## 2019-02-13 VITALS — BP 134/75 | HR 83 | Temp 97.4°F | Ht 73.0 in | Wt 283.2 lb

## 2019-02-13 DIAGNOSIS — R6889 Other general symptoms and signs: Secondary | ICD-10-CM

## 2019-02-13 DIAGNOSIS — J209 Acute bronchitis, unspecified: Secondary | ICD-10-CM | POA: Diagnosis not present

## 2019-02-13 LAB — VERITOR FLU A/B WAIVED
INFLUENZA A: NEGATIVE
Influenza B: NEGATIVE

## 2019-02-13 MED ORDER — PREDNISONE 10 MG (21) PO TBPK: ORAL_TABLET | ORAL | 0 refills | Status: DC

## 2019-02-13 NOTE — Patient Instructions (Signed)

## 2019-02-13 NOTE — Progress Notes (Signed)
Subjective:    Patient ID: Marvin Bender, male    DOB: 1956-09-10, 63 y.o.   MRN: [id number]  Chief Complaint  Patient presents with  . cough and congesdtion  . nasal drip  . Generalized Body Aches  . Fever    Fever   This is a new problem. Associated symptoms include coughing, headaches and a sore throat. Pertinent negatives include no ear pain or wheezing.  Cough  This is a new problem. The current episode started in the past 7 days. The problem has been gradually improving. The cough is productive of sputum. Associated symptoms include a fever, headaches, nasal congestion, postnasal drip, rhinorrhea and a sore throat. Pertinent negatives include no chills, ear congestion, ear pain, myalgias, shortness of breath or wheezing. He has tried rest and OTC cough suppressant for the symptoms. The treatment provided mild relief.      Review of Systems  Constitutional: Positive for fever. Negative for chills.  HENT: Positive for postnasal drip, rhinorrhea and sore throat. Negative for ear pain.   Respiratory: Positive for cough. Negative for shortness of breath and wheezing.   Musculoskeletal: Negative for myalgias.  Neurological: Positive for headaches.  All other systems reviewed and are negative.      Objective:   Physical Exam Vitals signs reviewed.  Constitutional:      General: He is not in acute distress.    Appearance: He is well-developed.  HENT:     Head: Normocephalic.     Right Ear: Tympanic membrane normal.     Left Ear: Tympanic membrane normal.     Nose: Mucosal edema and rhinorrhea present.     Mouth/Throat:     Pharynx: Posterior oropharyngeal erythema present.  Eyes:     General:        Right eye: No discharge.        Left eye: No discharge.     Pupils: Pupils are equal, round, and reactive to light.  Neck:     Musculoskeletal: Normal range of motion and neck supple.     Thyroid: No thyromegaly.  Cardiovascular:     Rate and Rhythm: Normal rate and  regular rhythm.     Heart sounds: Normal heart sounds. No murmur.  Pulmonary:     Effort: Pulmonary effort is normal. No respiratory distress.     Breath sounds: Wheezing present.  Abdominal:     General: Bowel sounds are normal. There is no distension.     Palpations: Abdomen is soft.     Tenderness: There is no abdominal tenderness.  Musculoskeletal: Normal range of motion.        General: No tenderness.  Skin:    General: Skin is warm and dry.     Findings: No erythema or rash.  Neurological:     Mental Status: He is alert and oriented to person, place, and time.     Cranial Nerves: No cranial nerve deficit.     Deep Tendon Reflexes: Reflexes are normal and symmetric.  Psychiatric:        Behavior: Behavior normal.        Thought Content: Thought content normal.        Judgment: Judgment normal.          BP 134/75   Pulse 83   Temp (!) 97.4 F (36.3 C) (Oral)   Ht 6\' 1"  (1.854 m)   Wt 283 lb 3.2 oz (128.5 kg)   BMI 37.36 kg/m   Assessment & Plan:  Trelon A Griess comes in today with chief complaint of cough and congesdtion; nasal drip; Generalized Body Aches; and Fever   Diagnosis and orders addressed:  1. Flu-like symptoms - Veritor Flu A/B Waived  2. Acute bronchitis, unspecified organism - Take meds as prescribed - Use a cool mist humidifier  -Use saline nose sprays frequently -Force fluids -For any cough or congestion  Use plain Mucinex- regular strength or max strength is fine -For fever or aces or pains- take tylenol or ibuprofen. -Throat lozenges if help -New toothbrush in 3 days RTO if symptoms worsen or do not improve  - predniSONE (STERAPRED UNI-PAK 21 TAB) 10 MG (21) TBPK tablet; Use as directed  Dispense: 21 tablet; Refill: 0  cah

## 2019-02-14 ENCOUNTER — Other Ambulatory Visit: Payer: Self-pay | Admitting: Cardiology

## 2019-03-11 ENCOUNTER — Other Ambulatory Visit: Payer: Self-pay | Admitting: Cardiology

## 2019-03-16 ENCOUNTER — Other Ambulatory Visit: Payer: Self-pay | Admitting: Cardiology

## 2019-04-18 DIAGNOSIS — E7800 Pure hypercholesterolemia, unspecified: Secondary | ICD-10-CM | POA: Diagnosis not present

## 2019-04-18 DIAGNOSIS — I10 Essential (primary) hypertension: Secondary | ICD-10-CM | POA: Diagnosis not present

## 2019-04-18 DIAGNOSIS — F411 Generalized anxiety disorder: Secondary | ICD-10-CM | POA: Diagnosis not present

## 2019-04-18 DIAGNOSIS — R0602 Shortness of breath: Secondary | ICD-10-CM | POA: Diagnosis not present

## 2019-04-21 DIAGNOSIS — E7800 Pure hypercholesterolemia, unspecified: Secondary | ICD-10-CM | POA: Diagnosis not present

## 2019-04-21 DIAGNOSIS — F411 Generalized anxiety disorder: Secondary | ICD-10-CM | POA: Diagnosis not present

## 2019-04-21 DIAGNOSIS — I10 Essential (primary) hypertension: Secondary | ICD-10-CM | POA: Diagnosis not present

## 2019-05-16 ENCOUNTER — Ambulatory Visit (INDEPENDENT_AMBULATORY_CARE_PROVIDER_SITE_OTHER): Payer: BLUE CROSS/BLUE SHIELD | Admitting: Internal Medicine

## 2019-05-16 ENCOUNTER — Other Ambulatory Visit: Payer: Self-pay

## 2019-05-16 ENCOUNTER — Encounter: Payer: Self-pay | Admitting: Internal Medicine

## 2019-05-16 ENCOUNTER — Ambulatory Visit (INDEPENDENT_AMBULATORY_CARE_PROVIDER_SITE_OTHER): Payer: BLUE CROSS/BLUE SHIELD

## 2019-05-16 VITALS — BP 132/74 | HR 82 | Ht 74.0 in | Wt 286.0 lb

## 2019-05-16 DIAGNOSIS — R0602 Shortness of breath: Secondary | ICD-10-CM | POA: Diagnosis not present

## 2019-05-16 DIAGNOSIS — R0609 Other forms of dyspnea: Secondary | ICD-10-CM

## 2019-05-16 DIAGNOSIS — R918 Other nonspecific abnormal finding of lung field: Secondary | ICD-10-CM | POA: Diagnosis not present

## 2019-05-16 LAB — CBC WITH DIFFERENTIAL/PLATELET
Basophils Absolute: 0.1 10*3/uL (ref 0.0–0.1)
Basophils Relative: 1.4 % (ref 0.0–3.0)
Eosinophils Absolute: 0.3 10*3/uL (ref 0.0–0.7)
Eosinophils Relative: 4.1 % (ref 0.0–5.0)
HCT: 42.4 % (ref 39.0–52.0)
Hemoglobin: 14.8 g/dL (ref 13.0–17.0)
Lymphocytes Relative: 26.6 % (ref 12.0–46.0)
Lymphs Abs: 2.1 10*3/uL (ref 0.7–4.0)
MCHC: 35 g/dL (ref 30.0–36.0)
MCV: 82.9 fl (ref 78.0–100.0)
Monocytes Absolute: 0.8 10*3/uL (ref 0.1–1.0)
Monocytes Relative: 9.8 % (ref 3.0–12.0)
Neutro Abs: 4.5 10*3/uL (ref 1.4–7.7)
Neutrophils Relative %: 58.1 % (ref 43.0–77.0)
Platelets: 355 10*3/uL (ref 150.0–400.0)
RBC: 5.12 Mil/uL (ref 4.22–5.81)
RDW: 13.2 % (ref 11.5–15.5)
WBC: 7.8 10*3/uL (ref 4.0–10.5)

## 2019-05-16 LAB — TSH: TSH: 3.63 u[IU]/mL (ref 0.35–4.50)

## 2019-05-16 LAB — BRAIN NATRIURETIC PEPTIDE: Pro B Natriuretic peptide (BNP): 74 pg/mL (ref 0.0–100.0)

## 2019-05-16 NOTE — Progress Notes (Addendum)
Marvin Bender, male    DOB: [birth year]    MRN: [id number]   Brief patient profile:   38  yowm quit smoking 2008 with no problems at that point and wt around  225 with onset of doe x 2012  eval with CT MPN/ neg MCT 2014  self referred to pulmonary clinic 09/28/2013 for second opinion   - Initial w/u by [facility] pulmonary requested > neg Methacholine, nl pft's  09/15/13 with FEV1  3.11 (87%) and ratio 82 and nl f/v loop - 09/28/2013  Walked RA x 3 laps @ 185 ft each stopped due to end of study sats still 88% - CPST  11/01/13 > nl with min ventilatory / restrictive issues related to body habitus   gradually worse doe x 2016 >>>  some better p albuterol and prednisone referred to pulmonary clinic 05/16/2019 by Dr Ambrose Pancoast   Also concerned 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 " but never grinded/ sanded / cut them      History of Present Illness  05/16/2019  Pulmonary/ 1st office eval/Wert (seen as new pt as > 3 y since prior eval)  Dyspnea:  MMRC1 = can walk nl pace, flat grade, can't hurry or go uphills or steps s sob / esp worse bending over  Cough: none though occ gets choked on food  Maybe a few times a month  Sleep: on side/ bed is flat  SABA use: with heavy exertion uses it p resting    No obvious day to day or daytime variability or assoc excess/ purulent sputum or mucus plugs or hemoptysis or cp or chest tightness, subjective wheeze or overt sinus or hb symptoms.   Sleeping as above without nocturnal  or early am exacerbation  of respiratory  c/o's or need for noct saba. Also denies any obvious fluctuation of symptoms with weather or environmental changes or other aggravating or alleviating factors except as outlined above   No unusual exposure hx or h/o childhood pna/ asthma or knowledge of premature birth.  Current Allergies, Complete Past Medical History, Past Surgical History, Family History, and Social History were reviewed in [employer] record.  ROS  The following are not active complaints unless bolded Hoarseness, sore throat, dysphagia, dental problems, itching, sneezing,  nasal congestion or discharge of excess mucus or purulent secretions, ear ache,   fever, chills, sweats, unintended wt loss or wt gain blames on TCA's, classically pleuritic or exertional cp,  orthopnea pnd or arm/hand swelling  or leg swelling, presyncope, palpitations, abdominal pain, anorexia, nausea, vomiting, diarrhea  or change in bowel habits or change in bladder habits, change in stools or change in urine, dysuria, hematuria,  rash, arthralgias, visual complaints, headache, numbness, weakness or ataxia or problems with walking or coordination,  change in mood=anxious or  memory.            Past Medical History:  Diagnosis Date  . Anxiety   . GERD (gastroesophageal reflux disease)   . Hypercholesteremia   . Hypertension     Outpatient Medications Prior to Visit  Medication Sig Dispense Refill  . albuterol (PROVENTIL HFA;VENTOLIN HFA) 108 (90 Base) MCG/ACT inhaler Inhale 2 puffs into the lungs every 6 (six) hours as needed for wheezing or shortness of breath. 1 Inhaler 0  . cetirizine (ZYRTEC) 10 MG tablet TAKE 1 TABLET BY MOUTH EVERY DAY 30 tablet 2  . chlorthalidone (HYGROTON) 25 MG tablet Take 1 tablet (25 mg  total) by mouth daily. 90 tablet 3  . LORazepam (ATIVAN) 0.5 MG tablet Take 0.5 mg by mouth every 8 (eight) hours as needed for anxiety.    Marland Kitchen losartan (COZAAR) 100 MG tablet Take 100 mg by mouth daily.    . metoprolol succinate (TOPROL-XL) 50 MG 24 hr tablet TAKE 1 TABLET (50 MG TOTAL) BY MOUTH DAILY. NEED OV. 90 tablet 0  . nortriptyline (PAMELOR) 25 MG capsule Take 25 mg by mouth at bedtime.    .            Objective:     BP 132/74 (BP Location: Left Arm, Cuff Size: Normal)   Pulse 82   Ht 6\' 2"  (1.88 m)   Wt 286 lb (129.7 kg)   SpO2 97%   BMI 36.72 kg/m      Wt Readings from Last 3 Encounters:  05/16/19 286 lb  (129.7 kg)  02/13/19 283 lb 3.2 oz (128.5 kg)  12/28/17 277 lb (125.6 kg)     Vital signs reviewed - Note on arrival 02 sats  97% on RA  Obese wm nad looks slt older than stated age   HEENT: nl dentition, turbinates bilaterally, and oropharynx. Nl external ear canals without cough reflex   NECK :  without JVD/Nodes/TM/ nl carotid upstrokes bilaterally   LUNGS: no acc muscle use,  Nl contour chest which is clear to A and P bilaterally without cough on insp or exp maneuvers   CV:  RRR  no s3 or murmur or increase in P2, and no edema   ABD:  Quite obese/ nontender with nl inspiratory excursion in the supine position. No bruits or organomegaly appreciated, bowel sounds nl  MS:  Nl gait/ ext warm without deformities, calf tenderness, cyanosis or clubbing No obvious joint restrictions   SKIN: warm and dry without lesions    NEURO:  alert, approp, nl sensorium with  no motor or cerebellar deficits apparent.      CXR PA and Lateral:   05/16/2019 :    I personally reviewed images and agree with radiology impression as follows:   No active cardiopulmonary disease.    Labs ordered/ reviewed:      Chemistry      Component Value Date/Time   NA 137 04/25/2015 0900   K 4.1 04/25/2015 0900   CL 102 04/25/2015 0900   CO2 28 04/25/2015 0900   BUN 14 04/25/2015 0900   CREATININE 0.94 04/25/2015 0900      Component Value Date/Time   CALCIUM 9.3 04/25/2015 0900   ALKPHOS 98 04/01/2015 1315   AST 28 04/01/2015 1315   ALT 30 04/01/2015 1315   BILITOT 1.0 04/01/2015 1315        Lab Results  Component Value Date   WBC 7.8 05/16/2019   HGB 14.8 05/16/2019   HCT 42.4 05/16/2019   MCV 82.9 05/16/2019   PLT 355.0 05/16/2019       EOS                                                              0.3  05/16/2019       Lab Results  Component Value Date   TSH 3.63 05/16/2019     Lab Results  Component Value Date   PROBNP 74.0 05/16/2019                  Assessment   DOE (dyspnea on exertion) Onset 2012  - Initial w/u by [facility] pulmonary requested > neg Methacholine  09/15/13 with baseline  FEV1  3.11 (87%) and ratio 82 and nl f/v loop - 09/28/2013  Walked RA x 3 laps @ 185 ft each stopped due to end of study sats still 88% - CPST  11/01/13 > nl with min ventilatory / restrictive issues related to body habitus - 05/16/2019   Walked RA  2 laps @  approx 23450ft each @ fast pace  stopped due to end of study, min sob with sats 98% at end     Symptoms are markedly disproportionate to objective findings and not clear to what extent this is actually a pulmonary  problem but pt does appear to have difficult to sort out respiratory symptoms of unknown origin for which  DDX  = almost all start with A and  include Adherence, Ace Inhibitors, Acid Reflux, Active Sinus Disease, Alpha 1 Antitripsin deficiency, Anxiety masquerading as Airways dz,  ABPA,  Allergy(esp in young), Aspiration (esp in elderly), Adverse effects of meds,  Active smoking or Vaping, A bunch of PE's/clot burden (a few small clots can't cause this syndrome unless there is already severe underlying pulm or vascular dz with poor reserve),  Anemia or thyroid disorder, plus two Bs  = Bronchiectasis and Beta blocker use..and one C= CHF   ? Acid (or non-acid) GERD > always difficult to exclude as up to 75% of pts in some series report no assoc GI/ Heartburn symptoms>   diet restrictions and GI f/u prn worse dysphagia rather than empiric rx given paucity of symptoms (no cough) that would indicate this is a major problem  ? Allergy/ asthma > neg MCT noted, doubt using saba correctly I spent extra time with pt today reviewing appropriate use of albuterol for prn use on exertion with the following points: 1) saba is for relief of sob that does not improve by walking a slower pace or resting but rather if the pt does not improve after trying this first. 2) If the pt is convinced, as many are,  that saba helps recover from activity faster then it's easy to tell if this is the case by re-challenging : ie stop, take the inhaler, then p 5 minutes try the exact same activity (intensity of workload) that just caused the symptoms and see if they are substantially diminished or not after saba 3) if there is an activity that reproducibly causes the symptoms, try the saba 15 min before the activity on alternate days   If in fact the saba really does help, then fine to continue to use it prn but advised may need to look closer at the maintenance regimen being used to achieve better control of airways disease with exertion.     ? Anxiety/depression/ wt gain/deconditioning  > usually at the bottom of this list of usual suspects but should be much higher on this pt's based on H and P and note already on psychotropics and may interfere with adherence and also interpretation of response or lack thereof to symptom management which can be quite subjective.   ? Anemia/ thyroid dz> both excluded today   ?  BB effects > doubt on such low doses of lopressor  ? chf > BNP excluded with bnp so low while symptomatic    Will try re-conditioning ex then return for pfts if needed p  COVID - 19 restrictions have been lifted.       Multiple pulmonary nodules Onset 2014  CT Chest 09/15/13 >  bilat lung nodules up to 7.5 mm on R - Repeat CT 03/30/14 No prior studies documenting pulmonary nodules are available. This CT scan of the thorax does not demonstrate pulmonary nodule or other acute abnormality. Mild likely chronic inflammatory interstitial changes noted > rec a 6 m f/u pft due after 04/20/14 vs prev study at [facility] 05/16/2019 ok     This many years out from prior study and smoking hx with nl cxr argues against CT chest needed at this point > defer to PCP re Low Dose screening for which he is only eligible x next 3 years (15 year limit p quit smoking and he is at 71 y out) and since there is a  possible remote (but minimal per hx ) exposure to asbestos does increase the risk slightly.    Discussed in detail all the  indications, usual  risks and alternatives  relative to the benefits with patient who agrees to proceed with conservative f/u as outlined        Total time devoted to counseling  > 50 % of initial 60 min office visit:  reviewed case with pt/wife Cathy by speaker phone/  directly observed portions of ambulatory 02 saturation study/  discussion of options/alternatives/ personally creating written customized instructions  in presence of pt  then going over those specific  Instructions directly with the pt including how to use all of the meds but in particular covering each new medication in detail and the difference between the maintenance= "automatic" meds and the prns using an action plan format for the latter (If this problem/symptom => do that organization reading Left to right).  Please see AVS from this visit for a full list of these instructions which I personally wrote for this pt and  are unique to this visit.      Sandrea Hughs, MD 05/16/2019

## 2019-05-16 NOTE — Patient Instructions (Signed)
Weight control is simply a matter of calorie balance which needs to be tilted in your favor by eating less and exercising more.  To get the most out of exercise, you need to be continuously aware that you are short of breath, but never out of breath, for 30 minutes daily. As you improve, it will actually be easier for you to do the same amount of exercise  in  30 minutes so always push to the level where you are short of breath.  If this does not result in gradual weight reduction then I strongly recommend you see a nutritionist with a food diary x 2 weeks so that we can work out a negative calorie balance which is universally effective in steady weight loss programs.  Think of your calorie balance like you do your bank account where in this case you want the balance to go down so you must take in less calories than you burn up.  It's just that simple:  Hard to do, but easy to understand.  Good luck!    If not making progress the first step is call for repeat full pfts but we are not doing them at present due to covid-19 restrictions   Please remember to go to the lab and x-ray department   for your tests - we will call you with the results when they are available.     Pulmonary follow up is as needed

## 2019-05-16 NOTE — Assessment & Plan Note (Addendum)
Onset 2014  CT Chest 09/15/13 >  bilat lung nodules up to 7.5 mm on R - Repeat CT 03/30/14 No prior studies documenting pulmonary nodules are available. This CT scan of the thorax does not demonstrate pulmonary nodule or other acute abnormality. Mild likely chronic inflammatory interstitial changes noted > rec a 6 m f/u pft due after 04/20/14 vs prev study at [facility] 05/16/2019 ok     This many years out from prior study and smoking hx with nl cxr argues against CT chest needed at this point > defer to PCP re Low Dose screening for which he is only eligible x next 3 years (15 year limit p quit smoking and he is at 73 y out) and since there is a possible remote (but minimal per hx ) exposure to asbestos does increase the risk slightly.     Discussed in detail all the  indications, usual  risks and alternatives  relative to the benefits with patient who agrees to proceed with conservative f/u as outlined     Total time devoted to counseling  > 50 % of initial 60 min office visit:  reviewed case with pt/wife Marvin Bender by speaker phone/  directly observed portions of ambulatory 02 saturation study/  discussion of options/alternatives/ personally creating written customized instructions  in presence of pt  then going over those specific  Instructions directly with the pt including how to use all of the meds but in particular covering each new medication in detail and the difference between the maintenance= "automatic" meds and the prns using an action plan format for the latter (If this problem/symptom => do that organization reading Left to right).  Please see AVS from this visit for a full list of these instructions which I personally wrote for this pt and  are unique to this visit.

## 2019-05-16 NOTE — Progress Notes (Signed)
Spoke with pt and notified of results per Dr. Wert. Pt verbalized understanding and denied any questions. 

## 2019-05-16 NOTE — Assessment & Plan Note (Addendum)
Onset 2012  - Initial w/u by [facility] requested > neg Methacholine  09/15/13 with baseline  FEV1  3.11 (87%) and ratio 82 and nl f/v loop - 09/28/2013  Walked RA x 3 laps @ 185 ft each stopped due to end of study sats still 88% - CPST  11/01/13 > nl with min ventilatory / restrictive issues related to body habitus - 05/16/2019   Walked RA  2 laps @  approx 231ft each @ fast pace  stopped due to end of study, min sob with sats 98% at end     Symptoms are markedly disproportionate to objective findings and not clear to what extent this is actually a pulmonary  problem but pt does appear to have difficult to sort out respiratory symptoms of unknown origin for which  DDX  = almost all start with A and  include Adherence, Ace Inhibitors, Acid Reflux, Active Sinus Disease, Alpha 1 Antitripsin deficiency, Anxiety masquerading as Airways dz,  ABPA,  Allergy(esp in young), Aspiration (esp in elderly), Adverse effects of meds,  Active smoking or Vaping, A bunch of PE's/clot burden (a few small clots can't cause this syndrome unless there is already severe underlying pulm or vascular dz with poor reserve),  Anemia or thyroid disorder, plus two Bs  = Bronchiectasis and Beta blocker use..and one C= CHF   ? Acid (or non-acid) GERD > always difficult to exclude as up to 75% of pts in some series report no assoc GI/ Heartburn symptoms>   diet restrictions and GI f/u prn worse dysphagia rather than empiric rx given paucity of symptoms (no cough) that would indicate this is a major problem  ? Allergy/ asthma > neg MCT noted, doubt using saba correctly I spent extra time with pt today reviewing appropriate use of albuterol for prn use on exertion with the following points: 1) saba is for relief of sob that does not improve by walking a slower pace or resting but rather if the pt does not improve after trying this first. 2) If the pt is convinced, as many are, that saba helps recover from activity faster then it's easy  to tell if this is the case by re-challenging : ie stop, take the inhaler, then p 5 minutes try the exact same activity (intensity of workload) that just caused the symptoms and see if they are substantially diminished or not after saba 3) if there is an activity that reproducibly causes the symptoms, try the saba 15 min before the activity on alternate days   If in fact the saba really does help, then fine to continue to use it prn but advised may need to look closer at the maintenance regimen being used to achieve better control of airways disease with exertion.     ? Anxiety/depression/ wt gain/deconditioning  > usually at the bottom of this list of usual suspects but should be much higher on this pt's based on H and P and note already on psychotropics and may interfere with adherence and also interpretation of response or lack thereof to symptom management which can be quite subjective.   ? Anemia/ thyroid dz> both excluded today   ? BB effects > doubt on such low doses of lopressor  ? chf > BNP excluded with bnp so low while symptomatic    Will try re-conditioning ex then return for pfts if needed p  COVID - 19 restrictions have been lifted.

## 2019-06-02 ENCOUNTER — Other Ambulatory Visit: Payer: Self-pay | Admitting: Family Medicine

## 2019-06-02 DIAGNOSIS — J209 Acute bronchitis, unspecified: Secondary | ICD-10-CM

## 2019-07-10 DIAGNOSIS — F411 Generalized anxiety disorder: Secondary | ICD-10-CM | POA: Diagnosis not present

## 2019-08-06 ENCOUNTER — Other Ambulatory Visit: Payer: Self-pay | Admitting: Cardiology

## 2019-10-10 ENCOUNTER — Encounter: Payer: Self-pay | Admitting: Nurse Practitioner

## 2019-10-10 ENCOUNTER — Ambulatory Visit (INDEPENDENT_AMBULATORY_CARE_PROVIDER_SITE_OTHER): Payer: Medicare Other | Admitting: Nurse Practitioner

## 2019-10-10 DIAGNOSIS — J0100 Acute maxillary sinusitis, unspecified: Secondary | ICD-10-CM | POA: Diagnosis not present

## 2019-10-10 MED ORDER — AMOXICILLIN-POT CLAVULANATE 875-125 MG PO TABS: 1.0000 | ORAL_TABLET | Freq: Two times a day (BID) | ORAL | 0 refills | Status: DC

## 2019-10-10 NOTE — Progress Notes (Signed)
Virtual Visit via telephone Note Due to COVID-19 pandemic this visit was conducted virtually. This visit type was conducted due to national recommendations for restrictions regarding the COVID-19 Pandemic (e.g. social distancing, sheltering in place) in an effort to limit this patient's exposure and mitigate transmission in our community. All issues noted in this document were discussed and addressed.  A physical exam was not performed with this format.  I connected with Marvin Bender on 10/10/19 at 3:05 by telephone and verified that I am speaking with the correct person using two identifiers. Marvin Bender is currently located at home and his wife  is currently with him during visit. The provider, Mary-Margaret Hassell Done, FNP is located in their office at time of visit.  I discussed the limitations, risks, security and privacy concerns of performing an evaluation and management service by telephone and the availability of in person appointments. I also discussed with the patient that there may be a patient responsible charge related to this service. The patient expressed understanding and agreed to proceed.   History and Present Illness:   Chief Complaint: URI   HPI Patient calls in c/o headache and facial pressure. He has had for several days. He was doing some pressure washing on his house Saturday and he has been sick since. He says he has not been around anyone with covid that he is aware of.    Review of Systems  Constitutional: Negative for chills and fever.  HENT: Positive for congestion, ear pain and sinus pain. Negative for sore throat.   Respiratory: Negative for cough.   Cardiovascular: Negative.   Genitourinary: Negative.   Neurological: Positive for dizziness and headaches.  Psychiatric/Behavioral: Negative.      Observations/Objective: Alert and oriented- answers all questions appropriately Mild  distress    Assessment and Plan: Marvin Bender in today with chief  complaint of URI   1. Acute non-recurrent maxillary sinusitis 1. Take meds as prescribed 2. Use a cool mist humidifier especially during the winter months and when heat has been humid. 3. Use saline nose sprays frequently 4. Saline irrigations of the nose can be very helpful if done frequently.  * 4X daily for 1 week*  * Use of a nettie pot can be helpful with this. Follow directions with this* 5. Drink plenty of fluids 6. Keep thermostat turn down low 7.For any cough or congestion  Use plain Mucinex- regular strength or max strength is fine   * Children- consult with Pharmacist for dosing 8. For fever or aces or pains- take tylenol or ibuprofen appropriate for age and weight.  * for fevers greater than 101 orally you may alternate ibuprofen and tylenol every  3 hours.   * refused to go for covid testing  Meds ordered this encounter  Medications  . amoxicillin-clavulanate (AUGMENTIN) 875-125 MG tablet    Sig: Take 1 tablet by mouth 2 (two) times daily.    Dispense:  14 tablet    Refill:  0    Order Specific Question:   Supervising Provider    Answer:   Caryl Pina A [[id number]]      Follow Up Instructions: prn    I discussed the assessment and treatment plan with the patient. The patient was provided an opportunity to ask questions and all were answered. The patient agreed with the plan and demonstrated an understanding of the instructions.   The patient was advised to call back or seek an in-person evaluation if the symptoms worsen  or if the condition fails to improve as anticipated.  The above assessment and management plan was discussed with the patient. The patient verbalized understanding of and has agreed to the management plan. Patient is aware to call the clinic if symptoms persist or worsen. Patient is aware when to return to the clinic for a follow-up visit. Patient educated on when it is appropriate to go to the emergency department.   Time call ended:  3:15   I provided 10 minutes of non-face-to-face time during this encounter.    Mary-Margaret Daphine Deutscher, FNP

## 2020-01-09 ENCOUNTER — Telehealth: Payer: Self-pay | Admitting: Family Medicine

## 2020-01-09 ENCOUNTER — Ambulatory Visit (INDEPENDENT_AMBULATORY_CARE_PROVIDER_SITE_OTHER): Payer: Medicare Other | Admitting: Family Medicine

## 2020-01-09 ENCOUNTER — Encounter: Payer: Self-pay | Admitting: Family Medicine

## 2020-01-09 DIAGNOSIS — J0110 Acute frontal sinusitis, unspecified: Secondary | ICD-10-CM | POA: Diagnosis not present

## 2020-01-09 DIAGNOSIS — J3489 Other specified disorders of nose and nasal sinuses: Secondary | ICD-10-CM

## 2020-01-09 MED ORDER — AMOXICILLIN-POT CLAVULANATE 875-125 MG PO TABS: 1.0000 | ORAL_TABLET | Freq: Two times a day (BID) | ORAL | 0 refills | Status: AC

## 2020-01-09 MED ORDER — AMOXICILLIN-POT CLAVULANATE 875-125 MG PO TABS: 1.0000 | ORAL_TABLET | Freq: Two times a day (BID) | ORAL | 0 refills | Status: DC

## 2020-01-09 MED ORDER — FLUTICASONE PROPIONATE 50 MCG/ACT NA SUSP: 2.0000 | Freq: Every day | NASAL | 6 refills | Status: DC

## 2020-01-09 NOTE — Progress Notes (Signed)
Virtual Visit via telephone Note Due to COVID-19 pandemic this visit was conducted virtually. This visit type was conducted due to national recommendations for restrictions regarding the COVID-19 Pandemic (e.g. social distancing, sheltering in place) in an effort to limit this patient's exposure and mitigate transmission in our community. All issues noted in this document were discussed and addressed.  A physical exam was not performed with this format.   I connected with Marvin Bender on 01/09/2020 at 1045 by telephone and verified that I am speaking with the correct person using two identifiers. Marvin Bender is currently located at home and family is currently with them during visit. The provider, Kari Baars, FNP is located in their office at time of visit.  I discussed the limitations, risks, security and privacy concerns of performing an evaluation and management service by telephone and the availability of in person appointments. I also discussed with the patient that there may be a patient responsible charge related to this service. The patient expressed understanding and agreed to proceed.  Subjective:  Patient ID: Marvin Bender, male    DOB: August 24, 1956, 64 y.o.   MRN: [id number]  Chief Complaint:  Sinus Problem   HPI: Marvin Bender is a 64 y.o. male presenting on 01/09/2020 for Sinus Problem   Pt reports worsening frontal sinus pressure despite increasing fluids and taking tylenol and Mucinex. States he has pressure in his forehead that is worse with bending over and when sleeping. States he has postnasal drainage and rhinorrhea.   Sinus Problem This is a new problem. The current episode started 1 to 4 weeks ago. The problem has been gradually worsening since onset. His pain is at a severity of 4/10. The pain is mild. Associated symptoms include chills, congestion, headaches, sinus pressure and a sore throat. Pertinent negatives include no coughing, diaphoresis, ear pain, hoarse voice,  neck pain, shortness of breath, sneezing or swollen glands. Past treatments include oral decongestants and acetaminophen. The treatment provided no relief.     Relevant past medical, surgical, family, and social history reviewed and updated as indicated.  Allergies and medications reviewed and updated.   Past Medical History:  Diagnosis Date  . Anxiety   . GERD (gastroesophageal reflux disease)   . Hypercholesteremia   . Hypertension     Past Surgical History:  Procedure Laterality Date  . KNEE ARTHROSCOPY WITH MEDIAL MENISECTOMY Left 04/30/2015   Procedure: LEFT KNEE ARTHROSCOPY WITH PARTIAL MEDIAL MENISECTOMY;  Surgeon: Darreld Mclean, MD;  Location: [facility];  Service: Orthopedics;  Laterality: Left;  . NO PAST SURGERIES      Social History   Socioeconomic History  . Marital status: Married    Spouse name: Not on file  . Number of children: Not on file  . Years of education: Not on file  . Highest education level: Not on file  Occupational History  . Occupation: Retired  Tobacco Use  . Smoking status: Former Smoker    Packs/day: 1.00    Years: 40.00    Pack years: 40.00    Types: Cigarettes    Quit date: 04/12/2007    Years since quitting: 12.7  . Smokeless tobacco: Never Used  Substance and Sexual Activity  . Alcohol use: Yes    Comment: occasional beer   . Drug use: No  . Sexual activity: Never    Birth control/protection: None  Other Topics Concern  . Not on file  Social History Narrative  . Not on file  Social Determinants of Health   Financial Resource Strain:   . Difficulty of Paying Living Expenses: Not on file  Food Insecurity:   . Worried About Charity fundraiser in the Last Year: Not on file  . Ran Out of Food in the Last Year: Not on file  Transportation Needs:   . Lack of Transportation (Medical): Not on file  . Lack of Transportation (Non-Medical): Not on file  Physical Activity:   . Days of Exercise per Week: Not on file  . Minutes of  Exercise per Session: Not on file  Stress:   . Feeling of Stress : Not on file  Social Connections:   . Frequency of Communication with Friends and Family: Not on file  . Frequency of Social Gatherings with Friends and Family: Not on file  . Attends Religious Services: Not on file  . Active Member of Clubs or Organizations: Not on file  . Attends Archivist Meetings: Not on file  . Marital Status: Not on file  Intimate Partner Violence:   . Fear of Current or Ex-Partner: Not on file  . Emotionally Abused: Not on file  . Physically Abused: Not on file  . Sexually Abused: Not on file    Outpatient Encounter Medications as of 01/09/2020  Medication Sig  . albuterol (PROVENTIL HFA;VENTOLIN HFA) 108 (90 Base) MCG/ACT inhaler Inhale 2 puffs into the lungs every 6 (six) hours as needed for wheezing or shortness of breath.  Marland Kitchen amoxicillin-clavulanate (AUGMENTIN) 875-125 MG tablet Take 1 tablet by mouth 2 (two) times daily for 10 days.  . cetirizine (ZYRTEC) 10 MG tablet TAKE 1 TABLET BY MOUTH EVERY DAY  . chlorthalidone (HYGROTON) 25 MG tablet Take 1 tablet (25 mg total) by mouth daily.  . fluticasone (FLONASE) 50 MCG/ACT nasal spray Place 2 sprays into both nostrils daily.  Marland Kitchen LORazepam (ATIVAN) 0.5 MG tablet Take 0.5 mg by mouth every 8 (eight) hours as needed for anxiety.  Marland Kitchen losartan (COZAAR) 100 MG tablet Take 100 mg by mouth daily.  . metoprolol succinate (TOPROL-XL) 50 MG 24 hr tablet TAKE 1 TABLET (50 MG TOTAL) BY MOUTH DAILY. NEED OV.  . nortriptyline (PAMELOR) 25 MG capsule Take 25 mg by mouth at bedtime.  . [DISCONTINUED] amoxicillin-clavulanate (AUGMENTIN) 875-125 MG tablet Take 1 tablet by mouth 2 (two) times daily.   No facility-administered encounter medications on file as of 01/09/2020.    Allergies  Allergen Reactions  . Duloxetine Diarrhea and Other (See Comments)    Abdominal pain, Nerveousness, Headache    Review of Systems  Constitutional: Positive for  chills. Negative for activity change, appetite change, diaphoresis, fatigue, fever and unexpected weight change.  HENT: Positive for congestion, postnasal drip, rhinorrhea, sinus pressure, sinus pain and sore throat. Negative for dental problem, ear discharge, ear pain, facial swelling, hearing loss, hoarse voice, mouth sores, nosebleeds and sneezing.   Eyes: Negative.  Negative for photophobia and visual disturbance.  Respiratory: Negative for cough, chest tightness and shortness of breath.   Cardiovascular: Negative for chest pain, palpitations and leg swelling.  Gastrointestinal: Negative for abdominal pain, blood in stool, constipation, diarrhea, nausea and vomiting.  Endocrine: Negative.   Genitourinary: Negative for decreased urine volume, difficulty urinating, dysuria, frequency and urgency.  Musculoskeletal: Negative for arthralgias, myalgias and neck pain.  Skin: Negative.   Allergic/Immunologic: Negative.   Neurological: Positive for headaches. Negative for dizziness, tremors, seizures, syncope, facial asymmetry, speech difficulty, weakness, light-headedness and numbness.  Hematological: Negative.   Psychiatric/Behavioral:  Negative for confusion, hallucinations, sleep disturbance and suicidal ideas.  All other systems reviewed and are negative.        Observations/Objective: No vital signs or physical exam, this was a telephone or virtual health encounter.  Pt alert and oriented, answers all questions appropriately, and able to speak in full sentences.    Assessment and Plan: Marvin Bender was seen today for sinus problem.  Diagnoses and all orders for this visit:  Frontal sinus pain Sinus congestion, failed Mucinex therapy, has not tried nasal decongestants, will initiate below.  -     fluticasone (FLONASE) 50 MCG/ACT nasal spray; Place 2 sprays into both nostrils daily.  Acute non-recurrent frontal sinusitis Ongoing sinusitis despite 7 days of symptomatic care. Reported  worsening symptoms. Will initiate Flonase and Augmentin due to failed symptomatic care. Adequate hydration discussed in detail. Continue symptomatic care at home. Report any new, worsening, or persistent symptoms. Follow up as needed.  -     fluticasone (FLONASE) 50 MCG/ACT nasal spray; Place 2 sprays into both nostrils daily. -     amoxicillin-clavulanate (AUGMENTIN) 875-125 MG tablet; Take 1 tablet by mouth 2 (two) times daily for 10 days.     Follow Up Instructions: Return if symptoms worsen or fail to improve.    I discussed the assessment and treatment plan with the patient. The patient was provided an opportunity to ask questions and all were answered. The patient agreed with the plan and demonstrated an understanding of the instructions.   The patient was advised to call back or seek an in-person evaluation if the symptoms worsen or if the condition fails to improve as anticipated.  The above assessment and management plan was discussed with the patient. The patient verbalized understanding of and has agreed to the management plan. Patient is aware to call the clinic if they develop any new symptoms or if symptoms persist or worsen. Patient is aware when to return to the clinic for a follow-up visit. Patient educated on when it is appropriate to go to the emergency department.    I provided 15 minutes of non-face-to-face time during this encounter. The call started at 1045. The call ended at 1100. The other time was used for coordination of care.    Kari Baars, FNP-C [facility] [address] [place], [place] [address] [phone] 01/09/2020

## 2020-01-09 NOTE — Telephone Encounter (Signed)
Re-sent to correct pharm

## 2020-05-17 ENCOUNTER — Other Ambulatory Visit: Payer: Self-pay

## 2020-05-17 ENCOUNTER — Encounter: Payer: Self-pay | Admitting: Family

## 2020-05-17 ENCOUNTER — Ambulatory Visit (INDEPENDENT_AMBULATORY_CARE_PROVIDER_SITE_OTHER): Payer: Medicare Other | Admitting: Family

## 2020-05-17 VITALS — BP 109/75 | HR 88 | Temp 97.3°F | Ht 74.0 in | Wt 270.1 lb

## 2020-05-17 DIAGNOSIS — S60561A Insect bite (nonvenomous) of right hand, initial encounter: Secondary | ICD-10-CM | POA: Diagnosis not present

## 2020-05-17 DIAGNOSIS — W57XXXA Bitten or stung by nonvenomous insect and other nonvenomous arthropods, initial encounter: Secondary | ICD-10-CM | POA: Diagnosis not present

## 2020-05-17 MED ORDER — PREDNISONE 10 MG (21) PO TBPK: ORAL_TABLET | ORAL | 0 refills | Status: DC

## 2020-05-17 MED ORDER — DOXYCYCLINE HYCLATE 100 MG PO TABS: 100.0000 mg | ORAL_TABLET | Freq: Two times a day (BID) | ORAL | 0 refills | Status: DC

## 2020-05-17 NOTE — Progress Notes (Signed)
Subjective:    Patient ID: Marvin Bender, male    DOB: 1956-10-19, 64 y.o.   MRN: [id number]  Chief Complaint  Patient presents with  . Bite    pt was bit/stung by something this morning around 6 and isn't sure what it was   PT presents to the office today with rash on right hand. States he was moving some pine needles and was "bite by something" this morning around 6 AM on his right hand. . He states the redness has spread, but denies any pain. Mild swelling. He has taken Benadryl without relief.   Rash This is a new problem. The current episode started today. The problem has been gradually worsening since onset. The rash is characterized by redness. He was exposed to an insect bite/sting. Pertinent negatives include no congestion, cough, diarrhea, eye pain, facial edema, fatigue, joint pain, rhinorrhea, shortness of breath, sore throat or vomiting. Past treatments include antihistamine. The treatment provided no relief.      Review of Systems  Constitutional: Negative for fatigue.  HENT: Negative for congestion, rhinorrhea and sore throat.   Eyes: Negative for pain.  Respiratory: Negative for cough and shortness of breath.   Gastrointestinal: Negative for diarrhea and vomiting.  Musculoskeletal: Negative for joint pain.  Skin: Positive for rash.  All other systems reviewed and are negative.      Objective:   Physical Exam Vitals reviewed.  Constitutional:      General: He is not in acute distress.    Appearance: He is well-developed.  HENT:     Head: Normocephalic.  Eyes:     General:        Right eye: No discharge.        Left eye: No discharge.     Pupils: Pupils are equal, round, and reactive to light.  Neck:     Thyroid: No thyromegaly.  Cardiovascular:     Rate and Rhythm: Regular rhythm. Tachycardia present.     Heart sounds: Normal heart sounds. No murmur.  Pulmonary:     Effort: Pulmonary effort is normal. No respiratory distress.     Breath sounds: Normal  breath sounds. No wheezing.  Abdominal:     General: Bowel sounds are normal. There is no distension.     Palpations: Abdomen is soft.     Tenderness: There is no abdominal tenderness.  Musculoskeletal:        General: No tenderness. Normal range of motion.     Cervical back: Normal range of motion and neck supple.  Skin:    General: Skin is warm and dry.     Findings: Rash (5X4CM on right dorsal hand) present. No erythema.  Neurological:     Mental Status: He is alert and oriented to person, place, and time.     Cranial Nerves: No cranial nerve deficit.     Deep Tendon Reflexes: Reflexes are normal and symmetric.  Psychiatric:        Behavior: Behavior normal.        Thought Content: Thought content normal.        Judgment: Judgment normal.         BP 109/75   Pulse 88   Temp (!) 97.3 F (36.3 C) (Temporal)   Ht 6\' 2"  (1.88 m)   Wt 270 lb 2 oz (122.5 kg)   BMI 34.68 kg/m      Assessment & Plan:  Marvin Comp Rucci comes in today with chief complaint of Bite (pt  was bit/stung by something this morning around 6 and isn't sure what it was)   Diagnosis and orders addressed:  1. Insect bite of right hand, initial encounter Report any s/s of infection, increased redness, swelling, pain, fever, or discharge Ice  Rest - doxycycline (VIBRA-TABS) 100 MG tablet; Take 1 tablet (100 mg total) by mouth 2 (two) times daily.  Dispense: 20 tablet; Refill: 0 - predniSONE (STERAPRED UNI-PAK 21 TAB) 10 MG (21) TBPK tablet; Use as directed  Dispense: 21 tablet; Refill: 0     Jannifer Rodney, FNP

## 2020-05-17 NOTE — Patient Instructions (Signed)
Insect Bite, Adult An insect bite can make your skin red, itchy, and swollen. An insect bite is different from an insect sting, which happens when an insect injects poison (venom) into the skin. Some insects can spread disease to people through a bite. However, most insect bites do not lead to disease and are not serious. What are the causes? Insects may bite for a variety of reasons, including:  Hunger.  To defend themselves. Insects that bite include:  Spiders.  Mosquitoes.  Ticks.  Fleas.  Ants.  Flies.  Kissing bugs.  Chiggers. What are the signs or symptoms? Symptoms of this condition include:  Itching or pain in the bite area.  Redness and swelling in the bite area.  An open wound (skin ulcer). In many cases, symptoms last for 2-4 days. In rare cases, a person may have a severe allergic reaction (anaphylactic reaction) to a bite. Symptoms of an anaphylactic reaction may include:  Feeling warm in the face (flushed). This may include redness.  Itchy, red, swollen areas of skin (hives).  Swelling of the eyes, lips, face, mouth, tongue, or throat.  Difficulty breathing, speaking, or swallowing.  Noisy breathing (wheezing).  Dizziness or light-headedness.  Fainting.  Pain or cramping in the abdomen.  Vomiting.  Diarrhea. How is this diagnosed? This condition is usually diagnosed based on symptoms and a physical exam. How is this treated? Treatment is usually not needed. Symptoms often go away on their own. When treatment is recommended, it may involve:  Applying a cream or lotion to the bite area. This treatment helps with itching.  Taking an antibiotic medicine. This treatment is needed if the bite area gets infected.  Getting a tetanus shot, if you are not up to date on this vaccine.  Applying ice to the affected area.  Allergy medicines called antihistamines. This treatment may be needed if you develop itching or an allergic reaction to the  insect bite.  Giving yourself an epinephrine injection if you have an anaphylactic reaction to a bite. To give the injection, you will use what is commonly called an auto-injector "pen" (pre-filled automatic epinephrine injection device). Your health care provider will teach you how to use an auto-injector pen. Follow these instructions at home: Bite area care   Do not scratch the bite area.  Keep the bite area clean and dry. Wash it every day with soap and water as told by your health care provider.  Check the bite area every day for signs of infection. Check for: ? Redness, swelling, or pain. ? Fluid or blood. ? Warmth. ? Pus or a bad smell. Managing pain, itching, and swelling   You may apply cortisone cream, calamine lotion, or a paste made of baking soda and water to the bite area as told by your health care provider.  If directed, put ice on the bite area. ? Put ice in a plastic bag. ? Place a towel between your skin and the bag. ? Leave the ice on for 20 minutes, 2-3 times a day. General instructions  Apply or take over-the-counter and prescription medicines only as told by your health care provider.  If you were prescribed an antibiotic medicine, take or apply it as told by your health care provider. Do not stop using the antibiotic even if your condition improves.  Keep all follow-up visits as told by your health care provider. This is important. How is this prevented? To help reduce your risk of insect bites:  When you are outdoors,   wear clothing that covers your arms and legs. This is especially important in the early morning and evening.  Use insect repellent. The best insect repellents contain DEET, picaridin, oil of lemon eucalyptus (OLE), or IR3535.  Consider spraying your clothing with a pesticide called permethrin. Permethrin helps prevent insect bites. It works for several weeks and for up to 5-6 clothing washes. Do not apply permethrin directly to the  skin.  If your home windows do not have screens, consider installing them.  If you will be sleeping in an area where there are mosquitoes, consider covering your sleeping area with a mosquito net. Contact a health care provider if:  You have redness, swelling, or pain in the bite area.  You have fluid or blood coming from the bite area.  The bite area feels warm to the touch.  You have pus or a bad smell coming from the bite area.  You have a fever. Get help right away if:  You have joint pain.  You have a rash.  You feel unusually tired or sleepy.  You have neck pain.  You have a headache.  You have unusual weakness.  You develop symptoms of an anaphylactic reaction. These may include: ? Flushed skin. ? Hives. ? Swelling of the eyes, lips, face, mouth, tongue, or throat. ? Difficulty breathing, speaking, or swallowing. ? Wheezing. ? Dizziness or light-headedness. ? Fainting. ? Pain or cramping in the abdomen. ? Vomiting. ? Diarrhea. These symptoms may represent a serious problem that is an emergency. Do not wait to see if the symptoms will go away. Do the following right away:  Use the auto-injector pen as you have been instructed.  Get medical help. Call your local emergency services (911 in the [place] Do not drive yourself to the hospital. Summary  An insect bite can make your skin red, itchy, and swollen.  Treatment is usually not needed. Symptoms often go away on their own. When treatment is recommended, it may involve taking medicine, applying medicine to the area, or applying ice.  Apply or take over-the-counter and prescription medicines only as told by your health care provider.  Use insect repellent to help prevent insect bites.  Contact a health care provider if you have any signs of infection in the bite area. This information is not intended to replace advice given to you by your health care provider. Make sure you discuss any questions you have  with your health care provider. Document Revised: 06/17/2018 Document Reviewed: 06/17/2018 Elsevier Patient Education  2020 [employer] Inc[employer]  

## 2020-12-04 ENCOUNTER — Ambulatory Visit (INDEPENDENT_AMBULATORY_CARE_PROVIDER_SITE_OTHER): Payer: Medicare Other | Admitting: Family Medicine

## 2020-12-04 ENCOUNTER — Encounter: Payer: Self-pay | Admitting: Family Medicine

## 2020-12-04 VITALS — BP 122/79

## 2020-12-04 DIAGNOSIS — J019 Acute sinusitis, unspecified: Secondary | ICD-10-CM

## 2020-12-04 DIAGNOSIS — B9689 Other specified bacterial agents as the cause of diseases classified elsewhere: Secondary | ICD-10-CM

## 2020-12-04 MED ORDER — CEFDINIR 300 MG PO CAPS: 300.0000 mg | ORAL_CAPSULE | Freq: Two times a day (BID) | ORAL | 0 refills | Status: DC

## 2020-12-04 NOTE — Progress Notes (Signed)
Telephone visit  Subjective: CC: Sinusitis PCP: Raliegh Ip, DO EPP:IRJJO A Beverlin is a 64 y.o. male calls for telephone consult today. Patient provides verbal consent for consult held via phone.  Due to COVID-19 pandemic this visit was conducted virtually. This visit type was conducted due to national recommendations for restrictions regarding the COVID-19 Pandemic (e.g. social distancing, sheltering in place) in an effort to limit this patient's exposure and mitigate transmission in our community. All issues noted in this document were discussed and addressed.  A physical exam was not performed with this format.   Location of patient: home Location of provider: [facility] Others present for call: wife  1. Sinusitis He reports sinus pressure on the left side and it is making him dizzy that onset Sunday am.  He reports rhinorrhea.  No cough, fevers, myalgia. No neurologic symptoms.  He uses flonase.  He feels like there is fluid in his left ear.  He was seen in ED for similar and was told he had a sinus infection and was treated with an antibiotic.  He was treated with amoxicillin.  ROS: Per HPI  Allergies  Allergen Reactions   Duloxetine Diarrhea and Other (See Comments)    Abdominal pain, Nerveousness, Headache   Past Medical History:  Diagnosis Date   Anxiety    GERD (gastroesophageal reflux disease)    Hypercholesteremia    Hypertension     Current Outpatient Medications:    albuterol (PROVENTIL HFA;VENTOLIN HFA) 108 (90 Base) MCG/ACT inhaler, Inhale 2 puffs into the lungs every 6 (six) hours as needed for wheezing or shortness of breath., Disp: 1 Inhaler, Rfl: 0   cetirizine (ZYRTEC) 10 MG tablet, TAKE 1 TABLET BY MOUTH EVERY DAY, Disp: 90 tablet, Rfl: 0   chlorthalidone (HYGROTON) 25 MG tablet, Take 1 tablet (25 mg total) by mouth daily., Disp: 90 tablet, Rfl: 3   doxycycline (VIBRA-TABS) 100 MG tablet, Take 1 tablet (100 mg total) by mouth 2 (two) times daily.,  Disp: 20 tablet, Rfl: 0   fluticasone (FLONASE) 50 MCG/ACT nasal spray, Place 2 sprays into both nostrils daily., Disp: 16 g, Rfl: 6   LORazepam (ATIVAN) 0.5 MG tablet, Take 0.5 mg by mouth every 8 (eight) hours as needed for anxiety., Disp: , Rfl:    losartan (COZAAR) 100 MG tablet, Take 100 mg by mouth daily., Disp: , Rfl:    metoprolol succinate (TOPROL-XL) 50 MG 24 hr tablet, TAKE 1 TABLET (50 MG TOTAL) BY MOUTH DAILY. NEED OV., Disp: 90 tablet, Rfl: 0   nortriptyline (PAMELOR) 25 MG capsule, Take 25 mg by mouth at bedtime., Disp: , Rfl:    predniSONE (STERAPRED UNI-PAK 21 TAB) 10 MG (21) TBPK tablet, Use as directed, Disp: 21 tablet, Rfl: 0   rosuvastatin (CRESTOR) 10 MG tablet, Take 10 mg by mouth at bedtime., Disp: , Rfl:   Assessment/ Plan: 64 y.o. male   Acute bacterial sinusitis - Plan: cefdinir (OMNICEF) 300 MG capsule  Pseudofed if needed, watch blood pressure.  Recently diagnosed with COVID19 in November, so unlikely to be another Covid infection at this point.  Will empirically treat with oral antibiotics.  Discussed that if symptoms persist he is to seek reevaluation.  Both he and his wife voiced good understanding will follow as needed  Start time: 8:15am End time: 8:24am  Total time spent on patient care (including telephone call/ virtual visit): 9 minutes  Ingra Rother Hulen Skains, DO [facility] [phone]

## 2021-06-11 ENCOUNTER — Other Ambulatory Visit: Payer: Self-pay

## 2021-06-11 ENCOUNTER — Emergency Department (HOSPITAL_COMMUNITY)
Admission: EM | Admit: 2021-06-11 | Discharge: 2021-06-11 | Disposition: A | Discharge status: Home / Self Care | Payer: Medicare Other | Attending: Emergency Medicine | Admitting: Emergency Medicine

## 2021-06-11 ENCOUNTER — Encounter (HOSPITAL_COMMUNITY): Payer: Self-pay

## 2021-06-11 ENCOUNTER — Telehealth (HOSPITAL_COMMUNITY): Payer: Self-pay | Admitting: Emergency Medicine

## 2021-06-11 DIAGNOSIS — R112 Nausea with vomiting, unspecified: Secondary | ICD-10-CM | POA: Diagnosis present

## 2021-06-11 DIAGNOSIS — Z87891 Personal history of nicotine dependence: Secondary | ICD-10-CM | POA: Diagnosis not present

## 2021-06-11 DIAGNOSIS — E876 Hypokalemia: Secondary | ICD-10-CM | POA: Insufficient documentation

## 2021-06-11 DIAGNOSIS — I10 Essential (primary) hypertension: Secondary | ICD-10-CM | POA: Insufficient documentation

## 2021-06-11 DIAGNOSIS — R110 Nausea: Secondary | ICD-10-CM

## 2021-06-11 LAB — CBC
HCT: 45.6 % (ref 39.0–52.0)
Hemoglobin: 15.2 g/dL (ref 13.0–17.0)
MCH: 28.6 pg (ref 26.0–34.0)
MCHC: 33.3 g/dL (ref 30.0–36.0)
MCV: 85.7 fL (ref 80.0–100.0)
Platelets: 302 10*3/uL (ref 150–400)
RBC: 5.32 MIL/uL (ref 4.22–5.81)
RDW: 13.8 % (ref 11.5–15.5)
WBC: 8.5 10*3/uL (ref 4.0–10.5)
nRBC: 0 % (ref 0.0–0.2)

## 2021-06-11 LAB — LIPASE, BLOOD: Lipase: 22 U/L (ref 11–51)

## 2021-06-11 LAB — COMPREHENSIVE METABOLIC PANEL
ALT: 22 U/L (ref 0–44)
AST: 22 U/L (ref 15–41)
Albumin: 3.8 g/dL (ref 3.5–5.0)
Alkaline Phosphatase: 85 U/L (ref 38–126)
Anion gap: 8 (ref 5–15)
BUN: 12 mg/dL (ref 8–23)
CO2: 28 mmol/L (ref 22–32)
Calcium: 9 mg/dL (ref 8.9–10.3)
Chloride: 102 mmol/L (ref 98–111)
Creatinine, Ser: 0.92 mg/dL (ref 0.61–1.24)
GFR, Estimated: >60 mL/min (ref >60)
Glucose, Bld: 129 mg/dL — ABNORMAL HIGH (ref 70–99)
Potassium: 2.8 mmol/L — ABNORMAL LOW (ref 3.5–5.1)
Sodium: 138 mmol/L (ref 135–145)
Total Bilirubin: 0.9 mg/dL (ref 0.3–1.2)
Total Protein: 7.5 g/dL (ref 6.5–8.1)

## 2021-06-11 MED ORDER — SODIUM CHLORIDE 0.9 % IV BOLUS
1000.0000 mL | Freq: Once | INTRAVENOUS | Status: AC
  Administered 2021-06-11: 1000 mL via INTRAVENOUS

## 2021-06-11 MED ORDER — ONDANSETRON HCL 4 MG/2ML IJ SOLN
4.0000 mg | Freq: Once | INTRAMUSCULAR | Status: AC
  Administered 2021-06-11: 4 mg via INTRAVENOUS
  Filled 2021-06-11: qty 2

## 2021-06-11 MED ORDER — ONDANSETRON 4 MG PO TBDP: 4.0000 mg | ORAL_TABLET | Freq: Three times a day (TID) | ORAL | 0 refills | Status: DC | PRN

## 2021-06-11 MED ORDER — POTASSIUM CHLORIDE CRYS ER 20 MEQ PO TBCR
40.0000 meq | EXTENDED_RELEASE_TABLET | Freq: Once | ORAL | Status: AC
  Administered 2021-06-11: 40 meq via ORAL
  Filled 2021-06-11: qty 2

## 2021-06-11 MED ORDER — MAGNESIUM OXIDE -MG SUPPLEMENT 400 (240 MG) MG PO TABS
800.0000 mg | ORAL_TABLET | Freq: Once | ORAL | Status: AC
  Administered 2021-06-11: 800 mg via ORAL
  Filled 2021-06-11: qty 2

## 2021-06-11 NOTE — Discharge Instructions (Addendum)
You were evaluated in the Emergency Department and after careful evaluation, we did not find any emergent condition requiring admission or further testing in the hospital.  Your exam/testing today was overall reassuring.  Recommend close follow-up with your primary care doctor to discuss your symptoms.  Also recommend follow-up with ENT regarding your left ear.  Please return to the Emergency Department if you experience any worsening of your condition.  Thank you for allowing [place] to be a part of your care.

## 2021-06-11 NOTE — ED Provider Notes (Signed)
[facility]-EMERGENCY DEPT [facility] Emergency Department Provider Note MRN:  [id number]  Arrival date & time: 06/11/21     Chief Complaint   Nausea and Emesis (N/v)   History of Present Illness   Marvin Bender is a 65 y.o. year-old male with a history of pretension, GERD presenting to the ED with chief complaint of vomiting.  Sudden onset nausea and vomiting at 3:45 AM.  Thinks that may be the chicken sandwich that he ate for dinner was "rotten".  Has been unable to stop vomiting, also endorsing full body diaphoresis.  Denies any chest pain, no abdominal pain, no diarrhea, no other complaints.  Symptoms are moderate to severe, constant, no exacerbating or alleviating factors  Review of Systems  A complete 10 system review of systems was obtained and all systems are negative except as noted in the HPI and PMH.   Patient's Health History    Past Medical History:  Diagnosis Date   Anxiety    GERD (gastroesophageal reflux disease)    Hypercholesteremia    Hypertension     Past Surgical History:  Procedure Laterality Date   KNEE ARTHROSCOPY WITH MEDIAL MENISECTOMY Left 04/30/2015   Procedure: LEFT KNEE ARTHROSCOPY WITH PARTIAL MEDIAL MENISECTOMY;  Surgeon: Darreld Mclean, MD;  Location: [facility];  Service: Orthopedics;  Laterality: Left;   NO PAST SURGERIES      Family History  Problem Relation Age of Onset   Asthma Mother    Heart disease Mother    Heart disease Sister    Colon cancer Neg Hx    Stomach cancer Neg Hx     Social History   Socioeconomic History   Marital status: Married    Spouse name: Not on file   Number of children: Not on file   Years of education: Not on file   Highest education level: Not on file  Occupational History   Occupation: Retired  Tobacco Use   Smoking status: Former    Packs/day: 1.00    Years: 40.00    Pack years: 40.00    Types: Cigarettes    Quit date: 04/12/2007    Years since quitting: 14.1   Smokeless tobacco: Never  Vaping Use    Vaping Use: Never used  Substance and Sexual Activity   Alcohol use: Yes    Comment: occasional beer    Drug use: No   Sexual activity: Never    Birth control/protection: None  Other Topics Concern   Not on file  Social History Narrative   Not on file   Social Determinants of Health   Financial Resource Strain: Not on file  Food Insecurity: Not on file  Transportation Needs: Not on file  Physical Activity: Not on file  Stress: Not on file  Social Connections: Not on file  Intimate Partner Violence: Not on file     Physical Exam   Vitals:   06/11/21 0530 06/11/21 0600  BP: 116/68 110/80  Pulse: (!) 50 (!) 50  Resp: 17 (!) 21  Temp:    SpO2: 94% 96%    CONSTITUTIONAL: Well-appearing, NAD NEURO:  Alert and oriented x 3, no focal deficits EYES:  eyes equal and reactive ENT/NECK:  no LAD, no JVD CARDIO: Regular rate, well-perfused, normal S1 and S2 PULM:  CTAB no wheezing or rhonchi GI/GU:  normal bowel sounds, non-distended, non-tender MSK/SPINE:  No gross deformities, no edema SKIN:  no rash, atraumatic PSYCH:  Appropriate speech and behavior  *Additional and/or pertinent findings included in MDM  below  Diagnostic and Interventional Summary    EKG Interpretation  Date/Time:  Wednesday June 11 2021 05:20:03 EDT Ventricular Rate:  54 PR Interval:  210 QRS Duration: 111 QT Interval:  489 QTC Calculation: 464 R Axis:   80 Text Interpretation: Sinus rhythm Atrial premature complexes Borderline T abnormalities, anterior leads No significant change was found Confirmed by Kennis Carina [id number]) on 06/11/2021 5:50:41 AM        Labs Reviewed  COMPREHENSIVE METABOLIC PANEL - Abnormal; Notable for the following components:      Result Value   Potassium 2.8 (*)    Glucose, Bld 129 (*)    All other components within normal limits  CBC  LIPASE, BLOOD    No orders to display    Medications  ondansetron (ZOFRAN) injection 4 mg (has no administration in time  range)  sodium chloride 0.9 % bolus 1,000 mL (1,000 mLs Intravenous New Bag/Given 06/11/21 0601)  potassium chloride SA (KLOR-CON) CR tablet 40 mEq (40 mEq Oral Given 06/11/21 0631)  magnesium oxide (MAG-OX) tablet 800 mg (800 mg Oral Given 06/11/21 0631)     Procedures  /  Critical Care Procedures  ED Course and Medical Decision Making  I have reviewed the triage vital signs, the nursing notes, and pertinent available records from the EMR.  Listed above are laboratory and imaging tests that I personally ordered, reviewed, and interpreted and then considered in my medical decision making (see below for details).  Suspect food poisoning, abdomen is completely soft and nontender with no rebound guarding or rigidity.  Vital signs are reassuring.  Patient overall looks well, will provide fluids, nausea medicine, check basic labs, reassess.  Currently without indication for imaging.     Labs overall reassuring, mild hypokalemia repleted here.  Patient feeling better, looks well, no symptoms at this time.  Has been having some left ear discomfort for few months, there is an irregular appearance to the TM, does not look infected but may be some fluid, advise ENT follow-up.  Otherwise no emergent process, appropriate for discharge.  Elmer Sow. Pilar Plate, MD [facility] Emergency Medicine [facility] [email or web address]  Final Clinical Impressions(s) / ED Diagnoses     ICD-10-CM   1. Nausea  R11.0       ED Discharge Orders     None        Discharge Instructions Discussed with and Provided to Patient:     Discharge Instructions      You were evaluated in the Emergency Department and after careful evaluation, we did not find any emergent condition requiring admission or further testing in the hospital.  Your exam/testing today was overall reassuring.  Recommend close follow-up with your primary care doctor to discuss your symptoms.  Also recommend follow-up with ENT  regarding your left ear.  Please return to the Emergency Department if you experience any worsening of your condition.  Thank you for allowing [place] to be a part of your care.         Sabas Sous, MD 06/11/21 [phone]

## 2021-06-11 NOTE — ED Triage Notes (Signed)
Pt arrived via [facility] stating that he began vomiting at 4am when he awoke et has not been able to stop. States that he has been sweating but denies any abd pain

## 2021-09-04 ENCOUNTER — Other Ambulatory Visit: Payer: Self-pay

## 2021-09-04 ENCOUNTER — Other Ambulatory Visit (HOSPITAL_COMMUNITY): Payer: Self-pay | Admitting: *Deleted

## 2021-09-04 ENCOUNTER — Observation Stay (HOSPITAL_COMMUNITY): Payer: Medicare Other

## 2021-09-04 ENCOUNTER — Observation Stay (HOSPITAL_BASED_OUTPATIENT_CLINIC_OR_DEPARTMENT_OTHER): Payer: Medicare Other

## 2021-09-04 ENCOUNTER — Observation Stay (HOSPITAL_COMMUNITY)
Admission: EM | Admit: 2021-09-04 | Discharge: 2021-09-05 | Disposition: A | Discharge status: Home / Self Care | Payer: Medicare Other | Attending: Internal Medicine | Admitting: Internal Medicine

## 2021-09-04 ENCOUNTER — Emergency Department (HOSPITAL_COMMUNITY): Payer: Medicare Other

## 2021-09-04 DIAGNOSIS — R739 Hyperglycemia, unspecified: Secondary | ICD-10-CM | POA: Insufficient documentation

## 2021-09-04 DIAGNOSIS — G459 Transient cerebral ischemic attack, unspecified: Secondary | ICD-10-CM

## 2021-09-04 DIAGNOSIS — Y900 Blood alcohol level of less than 20 mg/100 ml: Secondary | ICD-10-CM | POA: Insufficient documentation

## 2021-09-04 DIAGNOSIS — E876 Hypokalemia: Secondary | ICD-10-CM | POA: Insufficient documentation

## 2021-09-04 DIAGNOSIS — Z20822 Contact with and (suspected) exposure to covid-19: Secondary | ICD-10-CM | POA: Diagnosis not present

## 2021-09-04 DIAGNOSIS — Z6833 Body mass index (BMI) 33.0-33.9, adult: Secondary | ICD-10-CM

## 2021-09-04 DIAGNOSIS — I10 Essential (primary) hypertension: Secondary | ICD-10-CM | POA: Diagnosis not present

## 2021-09-04 DIAGNOSIS — Z87891 Personal history of nicotine dependence: Secondary | ICD-10-CM | POA: Insufficient documentation

## 2021-09-04 DIAGNOSIS — Z79899 Other long term (current) drug therapy: Secondary | ICD-10-CM | POA: Diagnosis not present

## 2021-09-04 DIAGNOSIS — E785 Hyperlipidemia, unspecified: Secondary | ICD-10-CM

## 2021-09-04 DIAGNOSIS — I639 Cerebral infarction, unspecified: Secondary | ICD-10-CM | POA: Diagnosis not present

## 2021-09-04 DIAGNOSIS — E6609 Other obesity due to excess calories: Secondary | ICD-10-CM

## 2021-09-04 DIAGNOSIS — J4522 Mild intermittent asthma with status asthmaticus: Secondary | ICD-10-CM

## 2021-09-04 DIAGNOSIS — R29810 Facial weakness: Secondary | ICD-10-CM | POA: Diagnosis present

## 2021-09-04 LAB — CBC
HCT: 47.1 % (ref 39.0–52.0)
Hemoglobin: 15.9 g/dL (ref 13.0–17.0)
MCH: 28.8 pg (ref 26.0–34.0)
MCHC: 33.8 g/dL (ref 30.0–36.0)
MCV: 85.3 fL (ref 80.0–100.0)
Platelets: 340 10*3/uL (ref 150–400)
RBC: 5.52 MIL/uL (ref 4.22–5.81)
RDW: 13.1 % (ref 11.5–15.5)
WBC: 7.6 10*3/uL (ref 4.0–10.5)
nRBC: 0 % (ref 0.0–0.2)

## 2021-09-04 LAB — URINALYSIS, ROUTINE W REFLEX MICROSCOPIC
Bilirubin Urine: NEGATIVE
Glucose, UA: NEGATIVE mg/dL
Hgb urine dipstick: NEGATIVE
Ketones, ur: NEGATIVE mg/dL
Leukocytes,Ua: NEGATIVE
Nitrite: NEGATIVE
Protein, ur: NEGATIVE mg/dL
Specific Gravity, Urine: 1.005 (ref 1.005–1.030)
pH: 7 (ref 5.0–8.0)

## 2021-09-04 LAB — GLUCOSE, CAPILLARY: Glucose-Capillary: 112 mg/dL — ABNORMAL HIGH (ref 70–99)

## 2021-09-04 LAB — DIFFERENTIAL
Abs Immature Granulocytes: 0.02 10*3/uL (ref 0.00–0.07)
Basophils Absolute: 0.1 10*3/uL (ref 0.0–0.1)
Basophils Relative: 2 %
Eosinophils Absolute: 0.5 10*3/uL (ref 0.0–0.5)
Eosinophils Relative: 7 %
Immature Granulocytes: 0 %
Lymphocytes Relative: 28 %
Lymphs Abs: 2.1 10*3/uL (ref 0.7–4.0)
Monocytes Absolute: 0.9 10*3/uL (ref 0.1–1.0)
Monocytes Relative: 12 %
Neutro Abs: 3.9 10*3/uL (ref 1.7–7.7)
Neutrophils Relative %: 51 %

## 2021-09-04 LAB — I-STAT CHEM 8, ED
BUN: 14 mg/dL (ref 8–23)
Calcium, Ion: 1.18 mmol/L (ref 1.15–1.40)
Chloride: 99 mmol/L (ref 98–111)
Creatinine, Ser: 0.7 mg/dL (ref 0.61–1.24)
Glucose, Bld: 117 mg/dL — ABNORMAL HIGH (ref 70–99)
HCT: 47 % (ref 39.0–52.0)
Hemoglobin: 16 g/dL (ref 13.0–17.0)
Potassium: 2.9 mmol/L — ABNORMAL LOW (ref 3.5–5.1)
Sodium: 139 mmol/L (ref 135–145)
TCO2: 27 mmol/L (ref 22–32)

## 2021-09-04 LAB — RAPID URINE DRUG SCREEN, HOSP PERFORMED
Amphetamines: NOT DETECTED
Barbiturates: NOT DETECTED
Benzodiazepines: NOT DETECTED
Cocaine: NOT DETECTED
Opiates: NOT DETECTED
Tetrahydrocannabinol: NOT DETECTED

## 2021-09-04 LAB — MAGNESIUM: Magnesium: 2 mg/dL (ref 1.7–2.4)

## 2021-09-04 LAB — COMPREHENSIVE METABOLIC PANEL
ALT: 31 U/L (ref 0–44)
AST: 28 U/L (ref 15–41)
Albumin: 4 g/dL (ref 3.5–5.0)
Alkaline Phosphatase: 89 U/L (ref 38–126)
Anion gap: 9 (ref 5–15)
BUN: 16 mg/dL (ref 8–23)
CO2: 27 mmol/L (ref 22–32)
Calcium: 9 mg/dL (ref 8.9–10.3)
Chloride: 99 mmol/L (ref 98–111)
Creatinine, Ser: 0.87 mg/dL (ref 0.61–1.24)
GFR, Estimated: >60 mL/min (ref >60)
Glucose, Bld: 118 mg/dL — ABNORMAL HIGH (ref 70–99)
Potassium: 2.9 mmol/L — ABNORMAL LOW (ref 3.5–5.1)
Sodium: 135 mmol/L (ref 135–145)
Total Bilirubin: 1.2 mg/dL (ref 0.3–1.2)
Total Protein: 7.8 g/dL (ref 6.5–8.1)

## 2021-09-04 LAB — ETHANOL: Alcohol, Ethyl (B): <10 mg/dL (ref <10)

## 2021-09-04 LAB — PROTIME-INR
INR: 0.9 (ref 0.8–1.2)
Prothrombin Time: 12.3 seconds (ref 11.4–15.2)

## 2021-09-04 LAB — RESP PANEL BY RT-PCR (FLU A&B, COVID) ARPGX2
Influenza A by PCR: NEGATIVE
Influenza B by PCR: NEGATIVE
SARS Coronavirus 2 by RT PCR: NEGATIVE

## 2021-09-04 LAB — VITAMIN B12: Vitamin B-12: 508 pg/mL (ref 180–914)

## 2021-09-04 LAB — TSH: TSH: 4.716 u[IU]/mL — ABNORMAL HIGH (ref 0.350–4.500)

## 2021-09-04 LAB — APTT: aPTT: 23 seconds — ABNORMAL LOW (ref 24–36)

## 2021-09-04 MED ORDER — ACETAMINOPHEN 160 MG/5ML PO SOLN: 650.0000 mg | ORAL | Status: DC | PRN

## 2021-09-04 MED ORDER — SENNOSIDES-DOCUSATE SODIUM 8.6-50 MG PO TABS: 1.0000 | ORAL_TABLET | Freq: Every evening | ORAL | Status: DC | PRN

## 2021-09-04 MED ORDER — POTASSIUM CHLORIDE CRYS ER 20 MEQ PO TBCR
40.0000 meq | EXTENDED_RELEASE_TABLET | ORAL | Status: AC
Start: 2021-09-04 — End: 2021-09-04
  Administered 2021-09-04 (×2): 40 meq via ORAL
  Filled 2021-09-04 (×2): qty 2

## 2021-09-04 MED ORDER — LOSARTAN POTASSIUM 50 MG PO TABS
100.0000 mg | ORAL_TABLET | Freq: Every day | ORAL | Status: DC
  Administered 2021-09-05: 100 mg via ORAL
  Filled 2021-09-04: qty 2

## 2021-09-04 MED ORDER — METOPROLOL SUCCINATE ER 50 MG PO TB24
50.0000 mg | ORAL_TABLET | Freq: Every day | ORAL | Status: DC
  Administered 2021-09-05: 50 mg via ORAL
  Filled 2021-09-04: qty 1

## 2021-09-04 MED ORDER — ALBUTEROL SULFATE (2.5 MG/3ML) 0.083% IN NEBU: 3.0000 mL | INHALATION_SOLUTION | Freq: Four times a day (QID) | RESPIRATORY_TRACT | Status: DC | PRN

## 2021-09-04 MED ORDER — ACETAMINOPHEN 650 MG RE SUPP: 650.0000 mg | RECTAL | Status: DC | PRN

## 2021-09-04 MED ORDER — FLUTICASONE PROPIONATE 50 MCG/ACT NA SUSP
2.0000 | Freq: Every day | NASAL | Status: DC
  Administered 2021-09-05: 2 via NASAL
  Filled 2021-09-04: qty 16

## 2021-09-04 MED ORDER — LORAZEPAM 0.5 MG PO TABS
0.5000 mg | ORAL_TABLET | Freq: Three times a day (TID) | ORAL | Status: DC | PRN
  Administered 2021-09-04 – 2021-09-05 (×2): 0.5 mg via ORAL
  Filled 2021-09-04 (×2): qty 1

## 2021-09-04 MED ORDER — ROSUVASTATIN CALCIUM 10 MG PO TABS
10.0000 mg | ORAL_TABLET | Freq: Every day | ORAL | Status: DC
  Administered 2021-09-04: 10 mg via ORAL
  Filled 2021-09-04: qty 1

## 2021-09-04 MED ORDER — ENOXAPARIN SODIUM 40 MG/0.4ML IJ SOSY: 40.0000 mg | PREFILLED_SYRINGE | INTRAMUSCULAR | Status: DC

## 2021-09-04 MED ORDER — POTASSIUM CHLORIDE CRYS ER 20 MEQ PO TBCR: 40.0000 meq | EXTENDED_RELEASE_TABLET | ORAL | Status: DC

## 2021-09-04 MED ORDER — LORAZEPAM 2 MG/ML IJ SOLN
1.0000 mg | Freq: Once | INTRAMUSCULAR | Status: AC
  Administered 2021-09-04: 1 mg via INTRAVENOUS
  Filled 2021-09-04: qty 1

## 2021-09-04 MED ORDER — SODIUM CHLORIDE 0.9 % IV SOLN: INTRAVENOUS | Status: DC

## 2021-09-04 MED ORDER — POTASSIUM CHLORIDE 10 MEQ/100ML IV SOLN
10.0000 meq | Freq: Once | INTRAVENOUS | Status: AC
  Administered 2021-09-04: 10 meq via INTRAVENOUS
  Filled 2021-09-04: qty 100

## 2021-09-04 MED ORDER — SODIUM CHLORIDE 0.9 % IV BOLUS
500.0000 mL | Freq: Once | INTRAVENOUS | Status: AC
  Administered 2021-09-04: 500 mL via INTRAVENOUS

## 2021-09-04 MED ORDER — ACETAMINOPHEN 325 MG PO TABS: 650.0000 mg | ORAL_TABLET | ORAL | Status: DC | PRN

## 2021-09-04 MED ORDER — NORTRIPTYLINE HCL 25 MG PO CAPS
25.0000 mg | ORAL_CAPSULE | Freq: Every day | ORAL | Status: DC
  Administered 2021-09-04: 25 mg via ORAL
  Filled 2021-09-04 (×2): qty 1

## 2021-09-04 MED ORDER — SODIUM CHLORIDE 0.9 % IV SOLN: 100.0000 mL/h | INTRAVENOUS | Status: DC

## 2021-09-04 MED ORDER — POTASSIUM CHLORIDE CRYS ER 20 MEQ PO TBCR
40.0000 meq | EXTENDED_RELEASE_TABLET | Freq: Once | ORAL | Status: AC
  Administered 2021-09-04: 40 meq via ORAL
  Filled 2021-09-04: qty 2

## 2021-09-04 MED ORDER — STROKE: EARLY STAGES OF RECOVERY BOOK
Freq: Once | Status: AC
  Filled 2021-09-04: qty 1

## 2021-09-04 MED ORDER — ASPIRIN 81 MG PO CHEW
81.0000 mg | CHEWABLE_TABLET | Freq: Once | ORAL | Status: AC
  Administered 2021-09-04: 81 mg via ORAL
  Filled 2021-09-04: qty 1

## 2021-09-04 MED ORDER — CLOPIDOGREL BISULFATE 75 MG PO TABS
75.0000 mg | ORAL_TABLET | Freq: Once | ORAL | Status: AC
  Administered 2021-09-04: 75 mg via ORAL
  Filled 2021-09-04: qty 1

## 2021-09-04 NOTE — Consult Note (Signed)
NEUROLOGY TELECONSULTATION NOTE   Date of service: September 04, 2021 Patient Name: Marvin Bender MRN:  [id number] DOB:  [birth year] Reason for consult: telestroke  Requesting Provider: Dr. Jacalyn Lefevre Consult Participants: myself, patient, bedside RN, telestroke RN Location of the provider: [facility] Location of the patient: [facility]  This consult was provided via telemedicine with 2-way video and audio communication. The patient/family was informed that care would be provided in this way and agreed to receive care in this manner.   _ _ _   _ __   _ __ _ _  __ __   _ __   __ _  History of Present Illness   Marvin Bender is a 65 year old gentleman with a history of hypertension and hypercholesterolemia who presented to the emergency department a [name] complaining of right-sided numbness in his face and his arm.  Last known well was 9:00 AM this morning.  His symptoms have nearly resolved to only include some numbness in the V1 distribution on the right.  He also feels like his right hand is somewhat slower and more incoordinated than usual however I cannot appreciate this over telestroke exam.  NIH stroke scale was 1 for sensory deficit.  Head CT was normal and showed no acute intracranial process.  TNKase was not administered due to low stroke scale and rapidly improving symptoms.  CTA was not performed because exam was not consistent with LVO.  He did have a similar episode of R sided numbness and blurry vision in R eye 3 days ago that lasted 7 seconds and resolved. He did not have any visual disturbance this AM.    ROS   Per HPI; all other systems reviewed and were negative  Past History   Past Medical History:  Diagnosis Date   Anxiety    GERD (gastroesophageal reflux disease)    Hypercholesteremia    Hypertension    Past Surgical History:  Procedure Laterality Date   KNEE ARTHROSCOPY WITH MEDIAL MENISECTOMY Left 04/30/2015   Procedure: LEFT KNEE ARTHROSCOPY WITH PARTIAL MEDIAL  MENISECTOMY;  Surgeon: Darreld Mclean, MD;  Location: [facility];  Service: Orthopedics;  Laterality: Left;   NO PAST SURGERIES     Family History  Problem Relation Age of Onset   Asthma Mother    Heart disease Mother    Heart disease Sister    Colon cancer Neg Hx    Stomach cancer Neg Hx    Social History   Socioeconomic History   Marital status: Married    Spouse name: Not on file   Number of children: Not on file   Years of education: Not on file   Highest education level: Not on file  Occupational History   Occupation: Retired  Tobacco Use   Smoking status: Former    Packs/day: 1.00    Years: 40.00    Pack years: 40.00    Types: Cigarettes    Quit date: 04/12/2007    Years since quitting: 14.4   Smokeless tobacco: Never  Vaping Use   Vaping Use: Never used  Substance and Sexual Activity   Alcohol use: Yes    Comment: occasional beer    Drug use: No   Sexual activity: Never    Birth control/protection: None  Other Topics Concern   Not on file  Social History Narrative   Not on file   Social Determinants of Health   Financial Resource Strain: Not on file  Food Insecurity: Not on file  Transportation Needs: Not  on file  Physical Activity: Not on file  Stress: Not on file  Social Connections: Not on file   Allergies  Allergen Reactions   Duloxetine Diarrhea and Other (See Comments)    Abdominal pain, Nerveousness, Headache    Medications   (Not in a hospital admission)    Vitals   Vitals:   09/04/21 1007 09/04/21 1030 09/04/21 1034  BP: 123/81 (!) 135/99   Pulse: 74 71   Resp: 18 15   Temp: 98.2 F (36.8 C)    TempSrc: Oral    SpO2: 98% 100%   Weight:   116.6 kg  Height:   6\' 1"  (1.854 m)     Body mass index is 33.91 kg/m.  Physical Exam   Exam performed over telemedicine with 2-way video and audio communication and with assistance of bedside RN  Physical Exam Gen: A&O x4, NAD Resp: normal WOB CV: extremities appear  well-perfused  Neuro: *MS: A&O x4. Follows multi-step commands.  *Speech: nondysarthric, no aphasia, able to name and repeat *CN: PERRL 26mm, EOMI, VFF by confrontation, sensation impaired V1 on R side, smile symmetric, hearing intact to voice *Motor:   Normal bulk.  No tremor, rigidity or bradykinesia. No pronator drift. All extremities appear full-strength and symmetric. *Sensory: SILT. Symmetric. No double-simultaneous extinction.  *Coordination:  Finger-to-nose, heel-to-shin, rapid alternating motions were intact. *Reflexes:  UTA 2/2 tele-exam *Gait: deferred  NIHSS = 1 for sensory deficit   Premorbid mRS = 0   Labs   CBC:  Recent Labs  Lab 09/04/21 1013 09/04/21 1025  WBC 7.6  --   NEUTROABS 3.9  --   HGB 15.9 16.0  HCT 47.1 47.0  MCV 85.3  --   PLT 340  --     Basic Metabolic Panel:  Lab Results  Component Value Date   NA 139 09/04/2021   K 2.9 (L) 09/04/2021   CO2 27 09/04/2021   GLUCOSE 117 (H) 09/04/2021   BUN 14 09/04/2021   CREATININE 0.70 09/04/2021   CALCIUM 9.0 09/04/2021   GFRNONAA >60 09/04/2021   GFRAA >60 04/25/2015   Lipid Panel: No results found for: LDLCALC HgbA1c: No results found for: HGBA1C Urine Drug Screen: No results found for: LABOPIA, COCAINSCRNUR, LABBENZ, AMPHETMU, THCU, LABBARB  Alcohol Level     Component Value Date/Time   ETH <10 09/04/2021 1013     Impression   Marvin Bender is a 65 year old gentleman with a history of hypertension and hypercholesterolemia who presented to the emergency department a 65 complaining of right-sided numbness in his face and his arm.  Sx nearly resolved; no indication for TNK. Recommend obs admission for TIA workup.  Recommendations   - Admit to hospitalist service for TIA workup - Inpatient neurology consult when available - Permissive HTN x48 hrs from sx onset or until stroke ruled out by MRI goal BP <220/110. PRN labetalol or hydralazine if BP above these parameters. Avoid oral  antihypertensives. - MRI brain wo contrast - CTA/MRA H&N - TTE  - Check A1c and LDL + add statin per guidelines - ASA 18mg  daily + plavix 75mg  daily x21 days f/b ASA 81mg  daily monotherapy after that - q4 hr neuro checks - STAT head CT for any change in neuro exam - Tele - PT/OT/SLP - Stroke education - Amb referral to neurology upon discharge   ______________________________________________________________________   Thank you for the opportunity to take part in the care of this patient. If you have any further questions, please contact  the neurology consultation attending.  Signed,  Bing Neighbors, MD [facility] [phone]  If 7pm- 7am, please page neurology on call as listed in AMION.

## 2021-09-04 NOTE — Plan of Care (Signed)
  Problem: Education: Goal: Knowledge of General Education information will improve Description Including pain rating scale, medication(s)/side effects and non-pharmacologic comfort measures Outcome: Progressing   Problem: Health Behavior/Discharge Planning: Goal: Ability to manage health-related needs will improve Outcome: Progressing   

## 2021-09-04 NOTE — ED Notes (Signed)
Patient transported to MRI 

## 2021-09-04 NOTE — ED Provider Notes (Addendum)
[facility] EMERGENCY DEPARTMENT Provider Note   CSN: [id number] Arrival date & time: 09/04/21  5053  An emergency department physician performed an initial assessment on this suspected stroke patient at 1010.  History Chief Complaint  Patient presents with   Numbness    Marvin Bender is a 65 y.o. male.  Pt presents to the ED today with difficulty seeing in right eye, right sided facial and arm numbness.  Sx started around 0900.  He was normal when he woke up.  Pt said he had a similar episode with just the vision disturbances last week, but it went away after about 10 minutes.  Pt denies any weakness.  No trouble talking or swallowing.      Past Medical History:  Diagnosis Date   Anxiety    GERD (gastroesophageal reflux disease)    Hypercholesteremia    Hypertension     Patient Active Problem List   Diagnosis Date Noted   Obesity (BMI 30-39.9) 05/20/2017   Multiple pulmonary nodules 09/30/2013   DOE (dyspnea on exertion) 01/09/2010   HYPERLIPIDEMIA 01/07/2010   ANXIETY 01/07/2010   Essential hypertension 01/07/2010   HEADACHE, CHRONIC 01/07/2010    Past Surgical History:  Procedure Laterality Date   KNEE ARTHROSCOPY WITH MEDIAL MENISECTOMY Left 04/30/2015   Procedure: LEFT KNEE ARTHROSCOPY WITH PARTIAL MEDIAL MENISECTOMY;  Surgeon: Darreld Mclean, MD;  Location: [facility];  Service: Orthopedics;  Laterality: Left;   NO PAST SURGERIES         Family History  Problem Relation Age of Onset   Asthma Mother    Heart disease Mother    Heart disease Sister    Colon cancer Neg Hx    Stomach cancer Neg Hx     Social History   Tobacco Use   Smoking status: Former    Packs/day: 1.00    Years: 40.00    Pack years: 40.00    Types: Cigarettes    Quit date: 04/12/2007    Years since quitting: 14.4   Smokeless tobacco: Never  Vaping Use   Vaping Use: Never used  Substance Use Topics   Alcohol use: Yes    Comment: occasional beer    Drug use: No    Home  Medications Prior to Admission medications   Medication Sig Start Date End Date Taking? Authorizing Provider  albuterol (PROVENTIL HFA;VENTOLIN HFA) 108 (90 Base) MCG/ACT inhaler Inhale 2 puffs into the lungs every 6 (six) hours as needed for wheezing or shortness of breath. 02/22/17   Dettinger, Elige Radon, MD  cefdinir (OMNICEF) 300 MG capsule Take 1 capsule (300 mg total) by mouth 2 (two) times daily. 1 po BID 12/04/20   Delynn Flavin M, DO  cetirizine (ZYRTEC) 10 MG tablet TAKE 1 TABLET BY MOUTH EVERY DAY 06/02/19   Delynn Flavin M, DO  chlorthalidone (HYGROTON) 25 MG tablet Take 1 tablet (25 mg total) by mouth daily. 05/20/17 05/17/20  Abelino Derrick, PA-C  fluticasone (FLONASE) 50 MCG/ACT nasal spray Place 2 sprays into both nostrils daily. 01/09/20   Sonny Masters, FNP  LORazepam (ATIVAN) 0.5 MG tablet Take 0.5 mg by mouth every 8 (eight) hours as needed for anxiety.    [provider]  losartan (COZAAR) 100 MG tablet Take 100 mg by mouth daily.    [provider]  metoprolol succinate (TOPROL-XL) 50 MG 24 hr tablet TAKE 1 TABLET (50 MG TOTAL) BY MOUTH DAILY. NEED OV. 03/16/19 05/17/20  Abelino Derrick, PA-C  nortriptyline (PAMELOR) 25  MG capsule Take 25 mg by mouth at bedtime.    [provider]  ondansetron (ZOFRAN ODT) 4 MG disintegrating tablet Take 1 tablet (4 mg total) by mouth every 8 (eight) hours as needed for nausea or vomiting. 06/11/21   Cheryll Cockayne, MD  rosuvastatin (CRESTOR) 10 MG tablet Take 10 mg by mouth at bedtime. 05/06/20   [provider]    Allergies    Duloxetine  Review of Systems   Review of Systems  Eyes:  Positive for visual disturbance.  Neurological:  Positive for numbness.  All other systems reviewed and are negative.  Physical Exam Updated Vital Signs BP (!) 135/99   Pulse 71   Temp 98.2 F (36.8 C) (Oral)   Resp 15   Ht 6\' 1"  (1.854 m)   Wt 116.6 kg   SpO2 100%   BMI 33.91 kg/m   Physical Exam Vitals and  nursing note reviewed.  Constitutional:      Appearance: Normal appearance.  HENT:     Head: Normocephalic and atraumatic.     Right Ear: External ear normal.     Left Ear: External ear normal.     Nose: Nose normal.     Mouth/Throat:     Mouth: Mucous membranes are moist.     Pharynx: Oropharynx is clear.  Eyes:     Extraocular Movements: Extraocular movements intact.     Conjunctiva/sclera: Conjunctivae normal.     Pupils: Pupils are equal, round, and reactive to light.  Cardiovascular:     Rate and Rhythm: Normal rate and regular rhythm.     Pulses: Normal pulses.     Heart sounds: Normal heart sounds.  Pulmonary:     Effort: Pulmonary effort is normal.     Breath sounds: Normal breath sounds.  Abdominal:     General: Abdomen is flat. Bowel sounds are normal.     Palpations: Abdomen is soft.  Musculoskeletal:        General: Normal range of motion.     Cervical back: Normal range of motion and neck supple.  Skin:    General: Skin is warm.     Capillary Refill: Capillary refill takes less than 2 seconds.  Neurological:     Mental Status: He is alert and oriented to person, place, and time.     Comments: Right sided face numbness, right arm numbness  Psychiatric:        Mood and Affect: Mood normal.        Behavior: Behavior normal.    ED Results / Procedures / Treatments   Labs (all labs ordered are listed, but only abnormal results are displayed) Labs Reviewed  APTT - Abnormal; Notable for the following components:      Result Value   aPTT 23 (*)    All other components within normal limits  COMPREHENSIVE METABOLIC PANEL - Abnormal; Notable for the following components:   Potassium 2.9 (*)    Glucose, Bld 118 (*)    All other components within normal limits  GLUCOSE, CAPILLARY - Abnormal; Notable for the following components:   Glucose-Capillary 112 (*)    All other components within normal limits  I-STAT CHEM 8, ED - Abnormal; Notable for the following  components:   Potassium 2.9 (*)    Glucose, Bld 117 (*)    All other components within normal limits  RESP PANEL BY RT-PCR (FLU A&B, COVID) ARPGX2  ETHANOL  PROTIME-INR  CBC  DIFFERENTIAL  MAGNESIUM  RAPID  URINE DRUG SCREEN, HOSP PERFORMED  URINALYSIS, ROUTINE W REFLEX MICROSCOPIC    EKG EKG Interpretation  Date/Time:  Thursday September 04 2021 10:38:11 EDT Ventricular Rate:  68 PR Interval:  198 QRS Duration: 101 QT Interval:  437 QTC Calculation: 465 R Axis:   61 Text Interpretation: Sinus rhythm RSR' in V1 or V2, right VCD or RVH Borderline T abnormalities, anterior leads No significant change since last tracing Confirmed by Jacalyn Lefevre [phone]) on 09/04/2021 11:06:18 AM  Radiology CT HEAD CODE STROKE WO CONTRAST  Result Date: 09/04/2021 CLINICAL DATA:  Code stroke. Right facial numbness. Right arm numbness and blurry vision beginning at 9 a.m. EXAM: CT HEAD WITHOUT CONTRAST TECHNIQUE: Contiguous axial images were obtained from the base of the skull through the vertex without intravenous contrast. COMPARISON:  None. FINDINGS: Brain: No evidence of acute infarction, hemorrhage, hydrocephalus, extra-axial collection or mass lesion/mass effect. 8 mm pineal cystic density, non worrisome at this size. Vascular: No hyperdense vessel or unexpected calcification. Skull: Normal. Negative for fracture or focal lesion. Sinuses/Orbits: No acute finding. Other: These results were called by telephone at the time of interpretation on 09/04/2021 at 10:22 am to provider [facility] , who verbally acknowledged these results. ASPECTS [facility] Xxx Stroke Program Early CT Score) - Ganglionic level infarction (caudate, lentiform nuclei, internal capsule, insula, M1-M3 cortex): 7 - Supraganglionic infarction (M4-M6 cortex): 3 Total score (0-10 with 10 being normal): 10 IMPRESSION: Negative for hemorrhage or visible infarct. Electronically Signed   By: Tiburcio Pea M.D.   On: 09/04/2021 10:22     Procedures Procedures   Medications Ordered in ED Medications  sodium chloride 0.9 % bolus 500 mL (500 mLs Intravenous New Bag/Given 09/04/21 1045)    Followed by  0.9 %  sodium chloride infusion (has no administration in time range)  potassium chloride 10 mEq in 100 mL IVPB (10 mEq Intravenous New Bag/Given 09/04/21 1053)  LORazepam (ATIVAN) injection 1 mg (has no administration in time range)  aspirin chewable tablet 81 mg (has no administration in time range)  clopidogrel (PLAVIX) tablet 75 mg (has no administration in time range)  potassium chloride SA (KLOR-CON) CR tablet 40 mEq (40 mEq Oral Given 09/04/21 1054)    ED Course  I have reviewed the triage vital signs and the nursing notes.  Pertinent labs & imaging results that were available during my care of the patient were reviewed by me and considered in my medical decision making (see chart for details).    MDM Rules/Calculators/A&P                           Tele neurology consult done by Dr. Darlyn Read.  Sx are improving, so he is a stroke scale of 1.  No indication for tnk as sx are improving. She recommends admission for tia work up.  She is putting a note in for additional recommendations.  Here they are:  - Admit to hospitalist service for TIA workup - Inpatient neurology consult when available - Permissive HTN x48 hrs from sx onset or until stroke ruled out by MRI goal BP <220/110. PRN labetalol or hydralazine if BP above these parameters. Avoid oral antihypertensives. - MRI brain wo contrast - CTA/MRA H&N - TTE  - Check A1c and LDL + add statin per guidelines - ASA 18mg  daily + plavix 75mg  daily x21 days f/b ASA 81mg  daily monotherapy after that - q4 hr neuro checks - STAT head CT for any change in  neuro exam - Tele - PT/OT/SLP - Stroke education - Amb referral to neurology upon discharge   Potassium is low (2.9).  IV and oral k ordered.  Mg level ordered.  Pt d/w Dr. Gwenlyn Perking ([facility]) for admission.  CRITICAL  CARE Performed by: Jacalyn Lefevre   Total critical care time: 30 minutes  Critical care time was exclusive of separately billable procedures and treating other patients.  Critical care was necessary to treat or prevent imminent or life-threatening deterioration.  Critical care was time spent personally by me on the following activities: development of treatment plan with patient and/or surrogate as well as nursing, discussions with consultants, evaluation of patient's response to treatment, examination of patient, obtaining history from patient or surrogate, ordering and performing treatments and interventions, ordering and review of laboratory studies, ordering and review of radiographic studies, pulse oximetry and re-evaluation of patient's condition.    Final Clinical Impression(s) / ED Diagnoses Final diagnoses:  TIA (transient ischemic attack)  Hypokalemia    Rx / DC Orders ED Discharge Orders     None        Jacalyn Lefevre, MD 09/04/21 1103    Jacalyn Lefevre, MD 09/04/21 1106

## 2021-09-04 NOTE — ED Notes (Signed)
Pt returned from MRI. Patient transported to Ultrasound.

## 2021-09-04 NOTE — ED Triage Notes (Signed)
Pt to er, pt states that he is here for some R sided numbness, states that about an hour ago he had a sudden onset of R sided numbness, pt has questionable facial droop, MD evaluating pt, code stroke called

## 2021-09-04 NOTE — Plan of Care (Signed)
  Problem: Education: Goal: Knowledge of General Education information will improve Description: Including pain rating scale, medication(s)/side effects and non-pharmacologic comfort measures Outcome: Progressing   Problem: Health Behavior/Discharge Planning: Goal: Ability to manage health-related needs will improve Outcome: Progressing   Problem: Education: Goal: Knowledge of disease or condition will improve Outcome: Progressing Goal: Knowledge of secondary prevention will improve Outcome: Progressing   

## 2021-09-04 NOTE — ED Notes (Signed)
Family and patient updated on plan of care per Dr. Gwenlyn Perking.Pt and family verbalized understanding.

## 2021-09-04 NOTE — H&P (Signed)
History and Physical    Marvin Bender XNA:[id number] DOB: Oct 11, 1956 DOA: 09/04/2021  PCP: System, Provider Not In   Patient coming from: Home  I have personally briefly reviewed patient's old medical records in [facility]  Chief Complaint: Blurred vision and right-sided numbness.  HPI: Marvin Bender is a 65 y.o. male with medical history significant of hypertension, hypercholesterolemia, anxiety, gastroesophageal reflux disease and class I obesity; who presented to the hospital secondary to blurry vision and right-sided numbness.  Patient expressed having similar symptoms about 1 week ago prior to admission while driving, he stopped safely to the side of the road and approximately 70 minutes symptoms completely resolve and went away.  On the day of admission after waking up he once again experience same symptoms which started around 9 AM but this time fail to improve past 10 minutes.  He presented to the emergency department for further evaluation and management.  Patient denies shortness of breath, chest pain, headaches, nausea, vomiting, dysuria, hematuria, trouble talking, swallowing or any focal motor deficits.  He reports to be compliant with medications.  COVID PCR Negative in the ED; no vaccinations.  ED Course: CT scan demonstrating no acute intracranial normalities.  EKG without acute ischemic changes.  Patient found to be hypokalemic with a potassium of 2.9.  Symptoms improving and patient found not to be candidate for tPA.  Case discussed with neurology who recommended admission to the hospital for TIA work-up.  Review of Systems: As per HPI otherwise all other systems reviewed and are negative.   Past Medical History:  Diagnosis Date   Anxiety    GERD (gastroesophageal reflux disease)    Hypercholesteremia    Hypertension     Past Surgical History:  Procedure Laterality Date   KNEE ARTHROSCOPY WITH MEDIAL MENISECTOMY Left 04/30/2015   Procedure: LEFT KNEE ARTHROSCOPY  WITH PARTIAL MEDIAL MENISECTOMY;  Surgeon: Darreld Mclean, MD;  Location: [facility];  Service: Orthopedics;  Laterality: Left;   NO PAST SURGERIES      Social History  reports that he quit smoking about 14 years ago. His smoking use included cigarettes. He has a 40.00 pack-year smoking history. He has never used smokeless tobacco. He reports current alcohol use. He reports that he does not use drugs.  Allergies  Allergen Reactions   Duloxetine Diarrhea and Other (See Comments)    Abdominal pain, Nerveousness, Headache    Family History  Problem Relation Age of Onset   Asthma Mother    Heart disease Mother    Heart disease Sister    Colon cancer Neg Hx    Stomach cancer Neg Hx     Prior to Admission medications   Medication Sig Start Date End Date Taking? Authorizing Provider  albuterol (PROVENTIL HFA;VENTOLIN HFA) 108 (90 Base) MCG/ACT inhaler Inhale 2 puffs into the lungs every 6 (six) hours as needed for wheezing or shortness of breath. 02/22/17   Dettinger, Elige Radon, MD  cetirizine (ZYRTEC) 10 MG tablet TAKE 1 TABLET BY MOUTH EVERY DAY 06/02/19   Delynn Flavin M, DO  chlorthalidone (HYGROTON) 25 MG tablet Take 1 tablet (25 mg total) by mouth daily. 05/20/17 05/17/20  Abelino Derrick, PA-C  fluticasone (FLONASE) 50 MCG/ACT nasal spray Place 2 sprays into both nostrils daily. 01/09/20   Sonny Masters, FNP  LORazepam (ATIVAN) 0.5 MG tablet Take 0.5 mg by mouth every 8 (eight) hours as needed for anxiety.    [provider]  losartan (COZAAR) 100 MG tablet  Take 100 mg by mouth daily.    [provider]  metoprolol succinate (TOPROL-XL) 50 MG 24 hr tablet TAKE 1 TABLET (50 MG TOTAL) BY MOUTH DAILY. NEED OV. 03/16/19 05/17/20  Abelino Derrick, PA-C  nortriptyline (PAMELOR) 25 MG capsule Take 25 mg by mouth at bedtime.    [provider]  ondansetron (ZOFRAN ODT) 4 MG disintegrating tablet Take 1 tablet (4 mg total) by mouth every 8 (eight) hours as needed for nausea or  vomiting. 06/11/21   Cheryll Cockayne, MD  rosuvastatin (CRESTOR) 10 MG tablet Take 10 mg by mouth at bedtime. 05/06/20   [provider]    Physical Exam: Vitals:   09/04/21 1007 09/04/21 1030 09/04/21 1034  BP: 123/81 (!) 135/99   Pulse: 74 71   Resp: 18 15   Temp: 98.2 F (36.8 C)    TempSrc: Oral    SpO2: 98% 100%   Weight:   116.6 kg  Height:   6\' 1"  (1.854 m)    Constitutional: NAD, calm, comfortable; no trouble swallowing, no dysarthria, reports no headaches.  Still expressing some right-sided numbness. Vitals:   09/04/21 1007 09/04/21 1030 09/04/21 1034  BP: 123/81 (!) 135/99   Pulse: 74 71   Resp: 18 15   Temp: 98.2 F (36.8 C)    TempSrc: Oral    SpO2: 98% 100%   Weight:   116.6 kg  Height:   6\' 1"  (1.854 m)   Eyes: PERRL, lids and conjunctivae normal, no icterus, no nystagmus. ENMT: Mucous membranes are moist. Posterior pharynx clear of any exudate or lesions. Neck: normal, supple, no masses, no thyromegaly, no JVD. Respiratory: clear to auscultation bilaterally, no wheezing, no crackles. Normal respiratory effort. No accessory muscle use.  Cardiovascular: Regular rate and rhythm, no murmurs / rubs / gallops.  No lower extremity edema appreciated; no carotid bruits. Abdomen: Obese, no tenderness, no masses palpated. No hepatosplenomegaly. Bowel sounds positive.  Musculoskeletal: no clubbing / cyanosis. No joint deformity upper and lower extremities. Good ROM, no contractures. Normal muscle tone.  Skin: no rashes, no petechiae. Neurologic: CN 2-12 grossly intact. Sensation intact, DTR normal. Strength 4/5 in all 4 limbs due to poor effort.  No focal deficit seen.  Patient expressing right-sided numbness. Psychiatric: Normal judgment and insight. Alert and oriented x 3. Normal mood.   Labs on Admission: I have personally reviewed following labs and imaging studies  CBC: Recent Labs  Lab 09/04/21 1013 09/04/21 1025  WBC 7.6  --   NEUTROABS 3.9  --   HGB  15.9 16.0  HCT 47.1 47.0  MCV 85.3  --   PLT 340  --     Basic Metabolic Panel: Recent Labs  Lab 09/04/21 1013 09/04/21 1025  NA 135 139  K 2.9* 2.9*  CL 99 99  CO2 27  --   GLUCOSE 118* 117*  BUN 16 14  CREATININE 0.87 0.70  CALCIUM 9.0  --   MG 2.0  --     GFR: Estimated Creatinine Clearance: 119.9 mL/min (by C-G formula based on SCr of 0.7 mg/dL).  Liver Function Tests: Recent Labs  Lab 09/04/21 1013  AST 28  ALT 31  ALKPHOS 89  BILITOT 1.2  PROT 7.8  ALBUMIN 4.0    Urine analysis:    Component Value Date/Time   COLORURINE YELLOW 04/25/2015 0900   APPEARANCEUR CLEAR 04/25/2015 0900   LABSPEC 1.015 04/25/2015 0900   PHURINE 6.0 04/25/2015 0900   GLUCOSEU NEGATIVE 04/25/2015  0900   HGBUR NEGATIVE 04/25/2015 0900   BILIRUBINUR NEGATIVE 04/25/2015 0900   BILIRUBINUR negative 04/11/2014 1747   KETONESUR NEGATIVE 04/25/2015 0900   PROTEINUR NEGATIVE 04/25/2015 0900   UROBILINOGEN 0.2 04/25/2015 0900   NITRITE NEGATIVE 04/25/2015 0900   LEUKOCYTESUR NEGATIVE 04/25/2015 0900    Radiological Exams on Admission: CT HEAD CODE STROKE WO CONTRAST  Result Date: 09/04/2021 CLINICAL DATA:  Code stroke. Right facial numbness. Right arm numbness and blurry vision beginning at 9 a.m. EXAM: CT HEAD WITHOUT CONTRAST TECHNIQUE: Contiguous axial images were obtained from the base of the skull through the vertex without intravenous contrast. COMPARISON:  None. FINDINGS: Brain: No evidence of acute infarction, hemorrhage, hydrocephalus, extra-axial collection or mass lesion/mass effect. 8 mm pineal cystic density, non worrisome at this size. Vascular: No hyperdense vessel or unexpected calcification. Skull: Normal. Negative for fracture or focal lesion. Sinuses/Orbits: No acute finding. Other: These results were called by telephone at the time of interpretation on 09/04/2021 at 10:22 am to provider [facility] , who verbally acknowledged these results. ASPECTS [facility] Stroke  Program Early CT Score) - Ganglionic level infarction (caudate, lentiform nuclei, internal capsule, insula, M1-M3 cortex): 7 - Supraganglionic infarction (M4-M6 cortex): 3 Total score (0-10 with 10 being normal): 10 IMPRESSION: Negative for hemorrhage or visible infarct. Electronically Signed   By: Tiburcio Pea M.D.   On: 09/04/2021 10:22    EKG: Independently reviewed.  No acute ischemic changes.  Assessment/Plan 1-TIA -Risk factors including age, hypertension and hyperlipidemia. -CT head without acute ischemic changes -MRI it also demonstrating no acute infarct. -Carotid Dopplers without significant plaque buildup in his carotid arteries and antegrade vertebral flow. -2D echo pending; neurology service recommended 21 days of baby aspirin and Plavix with subsequent use of daily baby aspirin for secondary prevention. -Continue the use of statins and follow lipid panel, A1c, TSH and B12 level.  2-hypertension -Resume home antihypertensive agents, except for continuation of diuretics in the setting of severe hypokalemia. -Follow vital signs. -Heart healthy diet has been encouraged.  3-hyperlipidemia -Continue statins -Follow lipid panel.  4-hyperglycemia -No prior history of diabetes -Follow A1c and repeat fasting glucose in AM.  5-hypokalemia -In the setting of diuretics usage most likely -Holding diuretics overnight -Will replete potassium and follow magnesium level.  6-class I obesity -Low calorie diet, portion control and increase physical activity discussed with patient. -Body mass index is 33.91 kg/m.  7-depression/anxiety -Continue with home Pamelor and as needed Ativan.  8-history of asthma -No wheezing or shortness of breath -Continue as needed bronchodilators.   DVT prophylaxis: Lovenox Code Status:   Full code Family Communication:  No family at bedside. Disposition Plan:   Patient is from:  Home  Anticipated DC to:  Home  Anticipated DC  date:  09/05/2021  Anticipated DC barriers: Complete TIA work-up.  Consults called:  Tele-neurology service (Dr. Darlyn Read) Admission status:  Observation, telemetry, length of stay less than 2 midnights.  Severity of Illness: The appropriate patient status for this patient is OBSERVATION. Observation status is judged to be reasonable and necessary in order to provide the required intensity of service to ensure the patient's safety. The patient's presenting symptoms, physical exam findings, and initial radiographic and laboratory data in the context of their medical condition is felt to place them at decreased risk for further clinical deterioration. Furthermore, it is anticipated that the patient will be medically stable for discharge from the hospital within 2 midnights of admission. The following factors support the patient status of  observation.   " The patient's presenting symptoms include blurred vision and right-sided numbness.. " The physical exam findings include patient expressing right-sided numbness and some decreased vision acuity.  No headaches, no chest pain, no shortness of breath, no nausea or vomiting.. " The initial radiographic and laboratory data are CT head without contrast not revealing acute infarct; MRI also without signs of acute infarcts.  Patient with potassium of 2.9 and significant risk factors granting him the need for observation placement in order to complete TIA work-up.Vassie Loll MD [facility]  How to contact the [facility] Attending or Consulting provider 7A - 7P or covering provider during after hours 7P -7A, for this patient?   Check the care team in [facility] and look for a) attending/consulting [facility] provider listed and b) the [facility] team listed Log into [email or web address] and use 's universal password to access. If you do not have the password, please contact the hospital operator. Locate the [facility] provider you are looking for under [facility] and  page to a number that you can be directly reached. If you still have difficulty reaching the provider, please page the [facility] (Director on Call) for the Hospitalists listed on amion for assistance.  09/04/2021, 11:21 AM

## 2021-09-05 DIAGNOSIS — E876 Hypokalemia: Secondary | ICD-10-CM | POA: Diagnosis not present

## 2021-09-05 DIAGNOSIS — G459 Transient cerebral ischemic attack, unspecified: Secondary | ICD-10-CM | POA: Diagnosis not present

## 2021-09-05 DIAGNOSIS — I639 Cerebral infarction, unspecified: Secondary | ICD-10-CM | POA: Diagnosis not present

## 2021-09-05 LAB — ECHOCARDIOGRAM COMPLETE
AR max vel: 2.83 cm2
AV Area VTI: 2.63 cm2
AV Area mean vel: 2.77 cm2
AV Mean grad: 2 mmHg
AV Peak grad: 4 mmHg
Ao pk vel: 1 m/s
Area-P 1/2: 3.48 cm2
Height: 73 in
MV VTI: 2.19 cm2
S' Lateral: 2.7 cm
Weight: 4112 oz

## 2021-09-05 LAB — BASIC METABOLIC PANEL
Anion gap: 7 (ref 5–15)
BUN: 13 mg/dL (ref 8–23)
CO2: 29 mmol/L (ref 22–32)
Calcium: 8.6 mg/dL — ABNORMAL LOW (ref 8.9–10.3)
Chloride: 102 mmol/L (ref 98–111)
Creatinine, Ser: 0.82 mg/dL (ref 0.61–1.24)
GFR, Estimated: >60 mL/min (ref >60)
Glucose, Bld: 111 mg/dL — ABNORMAL HIGH (ref 70–99)
Potassium: 3.2 mmol/L — ABNORMAL LOW (ref 3.5–5.1)
Sodium: 138 mmol/L (ref 135–145)

## 2021-09-05 LAB — CBC
HCT: 45.9 % (ref 39.0–52.0)
Hemoglobin: 15.5 g/dL (ref 13.0–17.0)
MCH: 29.1 pg (ref 26.0–34.0)
MCHC: 33.8 g/dL (ref 30.0–36.0)
MCV: 86.3 fL (ref 80.0–100.0)
Platelets: 315 10*3/uL (ref 150–400)
RBC: 5.32 MIL/uL (ref 4.22–5.81)
RDW: 13.2 % (ref 11.5–15.5)
WBC: 7.8 10*3/uL (ref 4.0–10.5)
nRBC: 0 % (ref 0.0–0.2)

## 2021-09-05 LAB — LIPID PANEL
Cholesterol: 165 mg/dL (ref 0–200)
HDL: 34 mg/dL — ABNORMAL LOW (ref >40)
LDL Cholesterol: 99 mg/dL (ref 0–99)
Total CHOL/HDL Ratio: 4.9 RATIO
Triglycerides: 158 mg/dL — ABNORMAL HIGH (ref <150)
VLDL: 32 mg/dL (ref 0–40)

## 2021-09-05 LAB — HEMOGLOBIN A1C
Hgb A1c MFr Bld: 5.4 % (ref 4.8–5.6)
Mean Plasma Glucose: 108.28 mg/dL

## 2021-09-05 LAB — HIV ANTIBODY (ROUTINE TESTING W REFLEX): HIV Screen 4th Generation wRfx: NONREACTIVE

## 2021-09-05 LAB — MAGNESIUM: Magnesium: 1.9 mg/dL (ref 1.7–2.4)

## 2021-09-05 MED ORDER — POTASSIUM CHLORIDE CRYS ER 20 MEQ PO TBCR
40.0000 meq | EXTENDED_RELEASE_TABLET | Freq: Every day | ORAL | Status: DC
  Administered 2021-09-05: 40 meq via ORAL
  Filled 2021-09-05: qty 2

## 2021-09-05 MED ORDER — KLOR-CON M20 20 MEQ PO TBCR: 40.0000 meq | EXTENDED_RELEASE_TABLET | Freq: Every day | ORAL | 1 refills | Status: AC

## 2021-09-05 MED ORDER — ROSUVASTATIN CALCIUM 10 MG PO TABS: 10.0000 mg | ORAL_TABLET | Freq: Every day | ORAL | 2 refills | Status: AC

## 2021-09-05 MED ORDER — CLOPIDOGREL BISULFATE 75 MG PO TABS: 75.0000 mg | ORAL_TABLET | Freq: Every day | ORAL | 0 refills | Status: AC

## 2021-09-05 MED ORDER — NORTRIPTYLINE HCL 25 MG PO CAPS: 25.0000 mg | ORAL_CAPSULE | Freq: Every day | ORAL | Status: DC

## 2021-09-05 MED ORDER — ASPIRIN EC 81 MG PO TBEC: 81.0000 mg | DELAYED_RELEASE_TABLET | Freq: Every day | ORAL | 2 refills | Status: AC

## 2021-09-05 NOTE — Care Management Obs Status (Signed)
MEDICARE OBSERVATION STATUS NOTIFICATION   Patient Details  Name: Marvin Bender MRN: [id number] Date of Birth: 15-Mar-1956   Medicare Observation Status Notification Given:  Yes    Corey Harold 09/05/2021, 2:25 PM

## 2021-09-05 NOTE — Discharge Summary (Signed)
Physician Discharge Summary  Marvin Bender WUJ:[id number] DOB: 07-24-1956 DOA: 09/04/2021  PCP: System, Provider Not In  Admit date: 09/04/2021 Discharge date: 09/05/2021  Time spent: 35 minutes  Recommendations for Outpatient Follow-up:  Repeat basic metabolic panel to follow twice and renal function Reassess blood pressure and adjust antihypertensive regimen as needed. Continue assisting patient with weight loss management.  Discharge Diagnoses:  Active Problems:   TIA (transient ischemic attack)   Hypokalemia Hypertension Hyperlipidemia Class I obesity Hyperglycemia Depression/anxiety Class I obesity  Discharge Condition: Stable and improved.  Discharged home with instruction to follow-up with PCP in 10 days.  CODE STATUS: Full code.  Diet recommendation: Heart healthy and low calorie diet.  Filed Weights   09/04/21 1034 09/04/21 2015  Weight: 116.6 kg 120.7 kg    History of present illness:  Marvin Bender is a 65 y.o. male with medical history significant of hypertension, hypercholesterolemia, anxiety, gastroesophageal reflux disease and class I obesity; who presented to the hospital secondary to blurry vision and right-sided numbness.  Patient expressed having similar symptoms about 1 week ago prior to admission while driving, he stopped safely to the side of the road and approximately 70 minutes symptoms completely resolve and went away.  On the day of admission after waking up he once again experience same symptoms which started around 9 AM but this time fail to improve past 10 minutes.  He presented to the emergency department for further evaluation and management.  Patient denies shortness of breath, chest pain, headaches, nausea, vomiting, dysuria, hematuria, trouble talking, swallowing or any focal motor deficits.  He reports to be compliant with medications.   COVID PCR Negative in the ED; no vaccinations.   ED Course: CT scan demonstrating no acute intracranial  normalities.  EKG without acute ischemic changes.  Patient found to be hypokalemic with a potassium of 2.9.  Symptoms improving and patient found not to be candidate for tPA.  Case discussed with neurology who recommended admission to the hospital for TIA work-up.  Hospital Course:  1-TIA -Patient risk factors including age, hypertension and hyperlipidemia -CT head and MRI demonstrating no acute intracranial abnormalities or acute infarct. -Carotid Dopplers without significant plaque buildup in his coronary arteries and with antegrade vertebral flow. -Follow recommendations by neurology service patient will complete 21 days of aspirin and Plavix with subsequent daily use of aspirin for secondary prevention. -No neurologic deficits at discharge and patient found to be safe to go home and follow-up with PCP. -Continue the use of blood pressure management, statins and risk factor modifications.  2-hypertension -Resume the use of antihypertensive agents; continue avoiding the use of chlorthalidone. -Patient advised to follow low-sodium diet.  3-hyperlipidemia -Continue statins. -LDL 99  4-hypokalemia -Repleted and patient discharged on daily maintenance supplementation. -Repeat basic metabolic panel to follow electrolytes trend and instability.  5-hyperglycemia -A1c demonstrating no diabetes -Patient advised lifestyle modifications.  6-depression/anxiety -Continue home antidepressant and anxiolytic medication -Stable mood.  7-history of asthma -Continue as needed bronchodilators.  8-class I obesity -Body mass index is 35.11 kg/m. -Low calorie diet, portion control and increase physical activity discussed with patient.  Procedures: See below for x-ray reports Carotid Dopplers: No significant plaque buildup in his carotid arteries; antegrade flow bilaterally. 2D echo:  1. Left ventricular ejection fraction, by estimation, is 60 to 65%. The  left ventricle has normal function.  The left ventricle has no regional  wall motion abnormalities. There is mild left ventricular hypertrophy.  Left ventricular diastolic parameters  are  indeterminate. Elevated left ventricular end-diastolic pressure.   2. Right ventricular systolic function is normal. The right ventricular  size is normal.   3. The mitral valve is grossly normal. No evidence of mitral valve  regurgitation.   4. The aortic valve is tricuspid. Aortic valve regurgitation is not  visualized. No aortic stenosis is present. Aortic valve mean gradient  measures 2.0 mmHg.   5. The inferior vena cava is normal in size with greater than 50%  respiratory variability, suggesting right atrial pressure of 3 mmHg.   Consultations: None  Discharge Exam: Vitals:   09/05/21 0215 09/05/21 1257  BP: (!) 131/92 100/66  Pulse: 62 67  Resp: 18 18  Temp: 97.6 F (36.4 C) 97.8 F (36.6 C)  SpO2: 92% 97%    General: Afebrile, no chest pain, no nausea, no vomiting.  Patient reports no headache, no pronator drift and express no numbness sensation.  Feeling ready to go home. Cardiovascular: S1 and S2, no rubs, no gallops, no JVD. Respiratory: Clear to auscultation bilaterally; no using accessory muscle. Abdomen: Soft, nontender, nondistended, positive bowel sounds Extremities: No cyanosis or clubbing.  Discharge Instructions   Discharge Instructions     Diet - low sodium heart healthy   Complete by: As directed    Discharge instructions   Complete by: As directed    Take medications as prescribed  Maintain adequate hydration  Arrange follow up with PCP in 10 days Follow heart healthy diet   Increase activity slowly   Complete by: As directed       Allergies as of 09/05/2021       Reactions   Duloxetine Diarrhea, Other (See Comments)   Abdominal pain, Nerveousness, Headache        Medication List     STOP taking these medications    chlorthalidone 25 MG tablet Commonly known as: HYGROTON    fluticasone 50 MCG/ACT nasal spray Commonly known as: FLONASE   ondansetron 4 MG disintegrating tablet Commonly known as: Zofran ODT       TAKE these medications    albuterol 108 (90 Base) MCG/ACT inhaler Commonly known as: VENTOLIN HFA Inhale 2 puffs into the lungs every 6 (six) hours as needed for wheezing or shortness of breath.   aspirin EC 81 MG tablet Take 1 tablet (81 mg total) by mouth daily. Swallow whole.   cetirizine 10 MG tablet Commonly known as: ZYRTEC TAKE 1 TABLET BY MOUTH EVERY DAY   clopidogrel 75 MG tablet Commonly known as: Plavix Take 1 tablet (75 mg total) by mouth daily for 21 days.   Klor-Con M20 20 MEQ tablet Generic drug: potassium chloride SA Take 2 tablets (40 mEq total) by mouth daily. What changed: how much to take   LORazepam 0.5 MG tablet Commonly known as: ATIVAN Take 0.5 mg by mouth every 8 (eight) hours as needed for anxiety.   losartan 100 MG tablet Commonly known as: COZAAR Take 50 mg by mouth daily.   metoprolol succinate 50 MG 24 hr tablet Commonly known as: TOPROL-XL TAKE 1 TABLET (50 MG TOTAL) BY MOUTH DAILY. NEED OV. What changed: additional instructions   nortriptyline 25 MG capsule Commonly known as: PAMELOR Take 1 capsule (25 mg total) by mouth at bedtime.   rosuvastatin 10 MG tablet Commonly known as: CRESTOR Take 1 tablet (10 mg total) by mouth at bedtime.   vitamin B-12 1000 MCG tablet Commonly known as: CYANOCOBALAMIN Take 2,000 mcg by mouth at bedtime.  Allergies  Allergen Reactions   Duloxetine Diarrhea and Other (See Comments)    Abdominal pain, Nerveousness, Headache    The results of significant diagnostics from this hospitalization (including imaging, microbiology, ancillary and laboratory) are listed below for reference.    Significant Diagnostic Studies: MR BRAIN WO CONTRAST  Result Date: 09/04/2021 CLINICAL DATA:  Neuro deficit, acute, stroke suspected. Additional history provided:  Right-sided numbness for 1 day. EXAM: MRI HEAD WITHOUT CONTRAST TECHNIQUE: Multiplanar, multiecho pulse sequences of the brain and surrounding structures were obtained without intravenous contrast. COMPARISON:  Noncontrast head CT performed earlier today 09/04/2021. FINDINGS: Brain: Intermittently motion degraded examination, limiting evaluation. Most notably, there is moderate motion degradation of the sagittal T1 weighted sequence, mild-to-moderate motion degradation of the axial T2 TSE sequence, moderate motion degradation of the axial T2* GRE sequence, severe motion degradation of the axial T2 FLAIR sequence and moderate motion degradation of the coronal T2 TSE sequence. Mild generalized cerebral and cerebellar atrophy. Mild multifocal T2/FLAIR hyperintensity within the cerebral white matter, nonspecific but compatible with chronic small vessel ischemic disease. 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 The cerebellar tonsils appear slightly low-lying, but within normal limits. There is no acute infarct. Within the limitations of motion degradation, no intracranial mass, chronic intracranial blood products or extra-axial fluid collection is identified. No midline shift. Vascular: Maintained flow voids within the proximal large arterial vessels. Skull and upper cervical spine: No focal suspicious marrow lesion. Sinuses/Orbits: Visualized orbits show no acute finding. Mild mucosal thickening within the bilateral ethmoid air cells. IMPRESSION: Motion degraded examination, as described and limiting evaluation. The diffusion-weighted imaging is of good quality. No evidence of acute infarction. No acute intracranial abnormality is identified. Mild chronic small-vessel ischemic changes within the cerebral white matter. Mild generalized parenchymal atrophy. Electronically Signed   By: Jackey Loge D.O.   On: 09/04/2021 12:33   US Carotid Bilateral (at [facility] and [facility] only)  Result Date: 09/04/2021 CLINICAL DATA:   Right-sided numbness with facial droop. History of hypertension and hyperlipidemia. Former smoker. EXAM: BILATERAL CAROTID DUPLEX ULTRASOUND TECHNIQUE: Wallace Cullens scale imaging, color Doppler and duplex ultrasound were performed of bilateral carotid and vertebral arteries in the neck. COMPARISON:  None. FINDINGS: Criteria: Quantification of carotid stenosis is based on velocity parameters that correlate the residual internal carotid diameter with NASCET-based stenosis levels, using the diameter of the distal internal carotid lumen as the denominator for stenosis measurement. The following velocity measurements were obtained: RIGHT ICA: 55/21 cm/sec CCA: 65/15 cm/sec SYSTOLIC ICA/CCA RATIO:  0.8 ECA: 91 cm/sec LEFT ICA: 50/22 cm/sec CCA: 72/18 cm/sec SYSTOLIC ICA/CCA RATIO:  0.7 ECA: 61 cm/sec RIGHT CAROTID ARTERY: There is no grayscale evidence of significant intimal thickening or atherosclerotic plaque affecting the interrogated portions of the right carotid system. There are no elevated peak systolic velocities within the interrogated course of the right internal carotid artery to suggest a hemodynamically significant stenosis. RIGHT VERTEBRAL ARTERY:  Antegrade flow LEFT CAROTID ARTERY: There is a minimal amount of hypoechoic plaque involving the left carotid bulb, extending to involve the origin and proximal aspects of the left internal carotid artery (image 59), not resulting in elevated peak systolic velocities within the interrogated course of the left internal carotid artery to suggest a hemodynamically significant stenosis. LEFT VERTEBRAL ARTERY:  Antegrade flow IMPRESSION: 1. Minimal amount of left-sided atherosclerotic plaque, not resulting in a hemodynamically significant stenosis. 2. Normal sonographic evaluation of the right carotid system. Electronically Signed   By: Simonne Come M.D.   On: 09/04/2021  12:49   CT HEAD CODE STROKE WO CONTRAST  Result Date: 09/04/2021 CLINICAL DATA:  Code stroke. Right  facial numbness. Right arm numbness and blurry vision beginning at 9 a.m. EXAM: CT HEAD WITHOUT CONTRAST TECHNIQUE: Contiguous axial images were obtained from the base of the skull through the vertex without intravenous contrast. COMPARISON:  None. FINDINGS: Brain: No evidence of acute infarction, hemorrhage, hydrocephalus, extra-axial collection or mass lesion/mass effect. 8 mm pineal cystic density, non worrisome at this size. Vascular: No hyperdense vessel or unexpected calcification. Skull: Normal. Negative for fracture or focal lesion. Sinuses/Orbits: No acute finding. Other: These results were called by telephone at the time of interpretation on 09/04/2021 at 10:22 am to provider [facility] , who verbally acknowledged these results. ASPECTS [facility] Stroke Program Early CT Score) - Ganglionic level infarction (caudate, lentiform nuclei, internal capsule, insula, M1-M3 cortex): 7 - Supraganglionic infarction (M4-M6 cortex): 3 Total score (0-10 with 10 being normal): 10 IMPRESSION: Negative for hemorrhage or visible infarct. Electronically Signed   By: Tiburcio Pea M.D.   On: 09/04/2021 10:22    Microbiology: Recent Results (from the past 240 hour(s))  Resp Panel by RT-PCR (Flu A&B, Covid) Nasopharyngeal Swab     Status: None   Collection Time: 09/04/21 10:46 AM   Specimen: Nasopharyngeal Swab; Nasopharyngeal(NP) swabs in vial transport medium  Result Value Ref Range Status   SARS Coronavirus 2 by RT PCR NEGATIVE NEGATIVE Final    Comment: (NOTE) SARS-CoV-2 target nucleic acids are NOT DETECTED.  The SARS-CoV-2 RNA is generally detectable in upper respiratory specimens during the acute phase of infection. The lowest concentration of SARS-CoV-2 viral copies this assay can detect is 138 copies/mL. A negative result does not preclude SARS-Cov-2 infection and should not be used as the sole basis for treatment or other patient management decisions. A negative result may occur with  improper  specimen collection/handling, submission of specimen other than nasopharyngeal swab, presence of viral mutation(s) within the areas targeted by this assay, and inadequate number of viral copies(<138 copies/mL). A negative result must be combined with clinical observations, patient history, and epidemiological information. The expected result is Negative.  Fact Sheet for Patients:  BloggerCourse.com  Fact Sheet for Healthcare Providers:  SeriousBroker.it  This test is no t yet approved or cleared by the [place] FDA and  has been authorized for detection and/or diagnosis of SARS-CoV-2 by FDA under an Emergency Use Authorization (EUA). This EUA will remain  in effect (meaning this test can be used) for the duration of the COVID-19 declaration under Section 564(b)(1) of the Act, 21 U.S.C.section 360bbb-3(b)(1), unless the authorization is terminated  or revoked sooner.       Influenza A by PCR NEGATIVE NEGATIVE Final   Influenza B by PCR NEGATIVE NEGATIVE Final    Comment: (NOTE) The Xpert Xpress SARS-CoV-2/FLU/RSV plus assay is intended as an aid in the diagnosis of influenza from Nasopharyngeal swab specimens and should not be used as a sole basis for treatment. Nasal washings and aspirates are unacceptable for Xpert Xpress SARS-CoV-2/FLU/RSV testing.  Fact Sheet for Patients: BloggerCourse.com  Fact Sheet for Healthcare Providers: SeriousBroker.it  This test is not yet approved or cleared by the [place] FDA and has been authorized for detection and/or diagnosis of SARS-CoV-2 by FDA under an Emergency Use Authorization (EUA). This EUA will remain in effect (meaning this test can be used) for the duration of the COVID-19 declaration under Section 564(b)(1) of the Act, 21 U.S.C. section 360bbb-3(b)(1), unless the  authorization is terminated or revoked.  Performed at  [facility], [address] [place], [place] [address]      Labs: Basic Metabolic Panel: Recent Labs  Lab 09/04/21 1013 09/04/21 1025 09/05/21 0607  NA 135 139 138  K 2.9* 2.9* 3.2*  CL 99 99 102  CO2 27  --  29  GLUCOSE 118* 117* 111*  BUN 16 14 13   CREATININE 0.87 0.70 0.82  CALCIUM 9.0  --  8.6*  MG 2.0  --  1.9   Liver Function Tests: Recent Labs  Lab 09/04/21 1013  AST 28  ALT 31  ALKPHOS 89  BILITOT 1.2  PROT 7.8  ALBUMIN 4.0   CBC: Recent Labs  Lab 09/04/21 1013 09/04/21 1025 09/05/21 0607  WBC 7.6  --  7.8  NEUTROABS 3.9  --   --   HGB 15.9 16.0 15.5  HCT 47.1 47.0 45.9  MCV 85.3  --  86.3  PLT 340  --  315   CBG: Recent Labs  Lab 09/04/21 1022  GLUCAP 112*    Signed:  09/06/21 MD.  [facility] 09/05/2021, 2:17 PM

## 2021-09-05 NOTE — Progress Notes (Signed)
10:06 call time 10:08 beeper time 1013 exam started 1014 exam finished 1015 images sent to [facility] 1017 Exam completed in epic 1017 [facility] radiology called

## 2021-09-05 NOTE — Evaluation (Signed)
Physical Therapy Evaluation Patient Details Name: Marvin Bender MRN: [id number] DOB: 1956/07/30 Today's Date: 09/05/2021  History of Present Illness  Marvin Bender is a 65 y.o. male with medical history significant of hypertension, hypercholesterolemia, anxiety, gastroesophageal reflux disease and class I obesity; who presented to the hospital secondary to blurry vision and right-sided numbness.  Patient expressed having similar symptoms about 1 week ago prior to admission while driving, he stopped safely to the side of the road and approximately 70 minutes symptoms completely resolve and went away.  On the day of admission after waking up he once again experience same symptoms which started around 9 AM but this time fail to improve past 10 minutes.  He presented to the emergency department for further evaluation and management.  Patient denies shortness of breath, chest pain, headaches, nausea, vomiting, dysuria, hematuria, trouble talking, swallowing or any focal motor deficits.  He reports to be compliant with medications.   Clinical Impression  Patient functioning at baseline for functional mobility and gait. Patient does not require assist with mobility today and is able to ambulate independently without AD and manage IV pole. Patient discharged to care of nursing for ambulation daily as tolerated for length of stay.        Recommendations for follow up therapy are one component of a multi-disciplinary discharge planning process, led by the attending physician.  Recommendations may be updated based on patient status, additional functional criteria and insurance authorization.  Follow Up Recommendations No PT follow up    Equipment Recommendations  None recommended by PT    Recommendations for Other Services       Precautions / Restrictions Precautions Precautions: None Restrictions Weight Bearing Restrictions: No      Mobility  Bed Mobility Overal bed mobility: Independent                   Transfers Overall transfer level: Independent                  Ambulation/Gait Ambulation/Gait assistance: Independent Gait Distance (Feet): 120 Feet Assistive device: None;IV Pole Gait Pattern/deviations: WFL(Within Functional Limits)     General Gait Details: ambulates without AD, able to manage IV pole  Stairs            Wheelchair Mobility    Modified Rankin (Stroke Patients Only)       Balance Overall balance assessment: Mild deficits observed, not formally tested                                           Pertinent Vitals/Pain Pain Assessment: No/denies pain    Home Living Family/patient expects to be discharged to:: Private residence Living Arrangements: Spouse/significant other;Children Available Help at Discharge: Family Type of Home: House Home Access: Stairs to enter   Secretary/administrator of Steps: 1 Home Layout: One level Home Equipment: None      Prior Function Level of Independence: Independent         Comments: Patient states independent with ADL, golfs     Hand Dominance   Dominant Hand: Right    Extremity/Trunk Assessment   Upper Extremity Assessment Upper Extremity Assessment: Defer to OT evaluation    Lower Extremity Assessment Lower Extremity Assessment: Overall WFL for tasks assessed    Cervical / Trunk Assessment Cervical / Trunk Assessment: Normal  Communication   Communication: No difficulties  Cognition  Arousal/Alertness: Awake/alert Behavior During Therapy: WFL for tasks assessed/performed Overall Cognitive Status: Within Functional Limits for tasks assessed                                        General Comments      Exercises     Assessment/Plan    PT Assessment Patent does not need any further PT services  PT Problem List         PT Treatment Interventions      PT Goals (Current goals can be found in the Care Plan section)  Acute  Rehab PT Goals Patient Stated Goal: return home PT Goal Formulation: With patient Time For Goal Achievement: 09/05/21 Potential to Achieve Goals: Good    Frequency     Barriers to discharge        Co-evaluation PT/OT/SLP Co-Evaluation/Treatment: Yes Reason for Co-Treatment: Complexity of the patient's impairments (multi-system involvement);To address functional/ADL transfers PT goals addressed during session: Mobility/safety with mobility;Balance OT goals addressed during session: ADL's and self-care       AM-PAC PT "6 Clicks" Mobility  Outcome Measure Help needed turning from your back to your side while in a flat bed without using bedrails?: None Help needed moving from lying on your back to sitting on the side of a flat bed without using bedrails?: None Help needed moving to and from a bed to a chair (including a wheelchair)?: None Help needed standing up from a chair using your arms (e.g., wheelchair or bedside chair)?: None Help needed to walk in hospital room?: None Help needed climbing 3-5 steps with a railing? : None 6 Click Score: 24    End of Session   Activity Tolerance: Patient tolerated treatment well Patient left: in bed Nurse Communication: Mobility status PT Visit Diagnosis: Other abnormalities of gait and mobility (R26.89)    Time: 4967-5916 PT Time Calculation (min) (ACUTE ONLY): 9 min   Charges:   PT Evaluation $PT Eval Low Complexity: 1 Low         9:18 AM, 09/05/21 Wyman Songster PT, DPT Physical Therapist at [facility]

## 2021-09-05 NOTE — Progress Notes (Signed)
SLP Cancellation Note  Patient Details Name: Marvin Bender MRN: [id number] DOB: 22-Jul-1956   Cancelled treatment:       Reason Eval/Treat Not Completed: SLP screened, no needs identified, will sign off; MRI negative for acute processes; pt stated symptoms have resolved since admission.  ST will s/o at this time.   Tressie Stalker, M.S., CCC-SLP 09/05/2021, 11:07 AM

## 2021-09-05 NOTE — Evaluation (Signed)
Occupational Therapy Evaluation Patient Details Name: Marvin Bender MRN: [id number] DOB: September 15, 1956 Today's Date: 09/05/2021   History of Present Illness Marvin Bender is a 65 y.o. male with medical history significant of hypertension, hypercholesterolemia, anxiety, gastroesophageal reflux disease and class I obesity; who presented to the hospital secondary to blurry vision and right-sided numbness.  Patient expressed having similar symptoms about 1 week ago prior to admission while driving, he stopped safely to the side of the road and approximately 70 minutes symptoms completely resolve and went away.  On the day of admission after waking up he once again experience same symptoms which started around 9 AM but this time fail to improve past 10 minutes.  He presented to the emergency department for further evaluation and management.  Patient denies shortness of breath, chest pain, headaches, nausea, vomiting, dysuria, hematuria, trouble talking, swallowing or any focal motor deficits.  He reports to be compliant with medications.   Clinical Impression   Pt appears to be functioning at or near baseline. Pt demonstrates good B UE strength with no observable deficits in vision. Pt was able to transfer within the room and ambulate in the hall independently. Pt is not recommended for further acute OT services and will be discharged to care of nursing staff for the remainder of his stay.      Recommendations for follow up therapy are one component of a multi-disciplinary discharge planning process, led by the attending physician.  Recommendations may be updated based on patient status, additional functional criteria and insurance authorization.   Follow Up Recommendations  No OT follow up    Equipment Recommendations  None recommended by OT           Precautions / Restrictions Precautions Precautions: None Restrictions Weight Bearing Restrictions: No      Mobility Bed Mobility Overal bed  mobility: Independent                  Transfers Overall transfer level: Independent                    Balance Overall balance assessment: Mild deficits observed, not formally tested                                         ADL either performed or assessed with clinical judgement   ADL Overall ADL's : Independent                                             Vision Baseline Vision/History: 0 No visual deficits Ability to See in Adequate Light: 0 Adequate Patient Visual Report: No change from baseline                  Pertinent Vitals/Pain Pain Assessment: No/denies pain     Hand Dominance Right   Extremity/Trunk Assessment Upper Extremity Assessment Upper Extremity Assessment: Overall WFL for tasks assessed   Lower Extremity Assessment Lower Extremity Assessment: Defer to PT evaluation   Cervical / Trunk Assessment Cervical / Trunk Assessment: Normal   Communication Communication Communication: No difficulties   Cognition Arousal/Alertness: Awake/alert Behavior During Therapy: WFL for tasks assessed/performed Overall Cognitive Status: Within Functional Limits for tasks assessed  Home Living Family/patient expects to be discharged to:: Private residence Living Arrangements: Spouse/significant other;Children Available Help at Discharge: Family Type of Home: House Home Access: Stairs to enter Secretary/administrator of Steps: 1   Home Layout: One level     Bathroom Shower/Tub: Tub/shower unit;Walk-in shower   Bathroom Toilet: Handicapped height     Home Equipment: None          Prior Functioning/Environment Level of Independence: Independent                         OT Treatment/Interventions:      OT Goals(Current goals can be found in the care plan section) Acute Rehab OT Goals Patient Stated Goal: return home                    Co-evaluation PT/OT/SLP Co-Evaluation/Treatment: Yes Reason for Co-Treatment: To address functional/ADL transfers   OT goals addressed during session: ADL's and self-care      AM-PAC OT "6 Clicks" Daily Activity     Outcome Measure Help from another person eating meals?: None Help from another person taking care of personal grooming?: None Help from another person toileting, which includes using toliet, bedpan, or urinal?: None Help from another person bathing (including washing, rinsing, drying)?: None Help from another person to put on and taking off regular upper body clothing?: None Help from another person to put on and taking off regular lower body clothing?: None 6 Click Score: 24   End of Session    Activity Tolerance: Patient tolerated treatment well Patient left: in bed;with call bell/phone within reach  OT Visit Diagnosis: Other symptoms and signs involving the nervous system (P10.258)                Time: 5277-8242 OT Time Calculation (min): 8 min Charges:  OT General Charges $OT Visit: 1 Visit OT Evaluation $OT Eval Low Complexity: 1 Low  Lisha Vitale OT, MOT  Danie Chandler 09/05/2021, 9:04 AM

## 2022-02-24 ENCOUNTER — Encounter: Payer: Self-pay | Admitting: Gastroenterology

## 2022-03-19 ENCOUNTER — Ambulatory Visit: Payer: Medicare Other | Admitting: Gastroenterology

## 2022-03-19 ENCOUNTER — Encounter: Payer: Self-pay | Admitting: Gastroenterology

## 2022-03-19 ENCOUNTER — Ambulatory Visit (INDEPENDENT_AMBULATORY_CARE_PROVIDER_SITE_OTHER): Payer: Medicare Other | Admitting: Gastroenterology

## 2022-03-19 VITALS — BP 126/72 | HR 65 | Ht 73.0 in | Wt 264.0 lb

## 2022-03-19 DIAGNOSIS — R1310 Dysphagia, unspecified: Secondary | ICD-10-CM

## 2022-03-19 DIAGNOSIS — K219 Gastro-esophageal reflux disease without esophagitis: Secondary | ICD-10-CM

## 2022-03-19 MED ORDER — PANTOPRAZOLE SODIUM 40 MG PO TBEC: 40.0000 mg | DELAYED_RELEASE_TABLET | Freq: Every day | ORAL | 11 refills | Status: DC

## 2022-03-19 NOTE — Patient Instructions (Signed)
We have sent the following medications to your pharmacy for you to pick up at your convenience:pantoprazole 40 mg daily. ? ? ?You have been scheduled for a Barium Esophogram at [facility] (1st floor of the hospital) on 04/06/22 at 9:30am. Please arrive 15 minutes prior to your appointment for registration. Make certain not to have anything to eat or drink 3 hours prior to your test. If you need to reschedule for any reason, please contact radiology at [phone] to do so. ?___________________________________________________________ ?A barium swallow is an examination that concentrates on views of the esophagus. This tends to be a double contrast exam (barium and two liquids which, when combined, create a gas to distend the wall of the oesophagus) or single contrast (non-ionic iodine based). The study is usually tailored to your symptoms so a good history is essential. Attention is paid during the study to the form, structure and configuration of the esophagus, looking for functional disorders (such as aspiration, dysphagia, achalasia, motility and reflux) ?EXAMINATION ?You may be asked to change into a gown, depending on the type of swallow being performed. A radiologist and radiographer will perform the procedure. The radiologist will advise you of the ?type of contrast selected for your procedure and direct you during the exam. You will be asked to stand, sit or lie in several different positions and to hold a small amount of fluid in your mouth before being asked to swallow while the imaging is performed .In some instances you may be asked to swallow barium coated marshmallows to assess the motility of a solid food bolus. ?The exam can be recorded as a digital or video fluoroscopy procedure. ?POST PROCEDURE ?It will take 1-2 days for the barium to pass through your system. To facilitate this, it is important, unless otherwise directed, to increase your fluids for the next 24-48hrs and to resume your  normal diet.  ?This test typically takes about 30 minutes to perform. ?________________________________________________________________ ? ?You have been scheduled for an endoscopy. Please follow written instructions given to you at your visit today. ?If you use inhalers (even only as needed), please bring them with you on the day of your procedure. ? ?The [facility] providers would like to encourage you to use [facility] to communicate with providers for non-urgent requests or questions.  Due to long hold times on the telephone, sending your provider a message by [facility] may be a faster and more efficient way to get a response.  Please allow 48 business hours for a response.  Please remember that this is for non-urgent requests.  ? ?Due to recent changes in healthcare laws, you may see the results of your imaging and laboratory studies on MyChart before your provider has had a chance to review them.  We understand that in some cases there may be results that are confusing or concerning to you. Not all laboratory results come back in the same time frame and the provider may be waiting for multiple results in order to interpret others.  Please give [place] 48 hours in order for your provider to thoroughly review all the results before contacting the office for clarification of your results.  ? ?Thank you for choosing me and [facility]. ? ?Malcolm T. Pleas Koch., MD., Clement[facility] Graham ? ? ?

## 2022-03-19 NOTE — Progress Notes (Signed)
? ? ?History of Present Illness: This is a 65 year old male self referred for the evaluation of dysphagia.  He is accompanied by his wife.  He relates a long history of frequent reflux symptoms with heartburn and regurgitation.  He has not been treated for this problem.  He relates intermittent problems with solid food dysphagia with severe lower sternal pain associated with what appears to be transient esophageal food impactions.  For the past several weeks he has been cutting all meats into very small bites and has not noted any dysphagia. Denies weight loss, abdominal pain, constipation, diarrhea, change in stool caliber, melena, hematochezia, nausea, vomiting, chest pain. ? ?Colonoscopy 04/2012 was normal  ? ? ?Allergies  ?Allergen Reactions  ? Duloxetine Diarrhea and Other (See Comments)  ?  Abdominal pain, Nerveousness, Headache  ? ?Outpatient Medications Prior to Visit  ?Medication Sig Dispense Refill  ? albuterol (PROVENTIL HFA;VENTOLIN HFA) 108 (90 Base) MCG/ACT inhaler Inhale 2 puffs into the lungs every 6 (six) hours as needed for wheezing or shortness of breath. 1 Inhaler 0  ? aspirin EC 81 MG tablet Take 1 tablet (81 mg total) by mouth daily. Swallow whole. 150 tablet 2  ? cetirizine (ZYRTEC) 10 MG tablet TAKE 1 TABLET BY MOUTH EVERY DAY 90 tablet 0  ? citalopram (CELEXA) 10 MG tablet Take 10 mg by mouth daily.    ? DENTA 5000 PLUS 1.1 % CREA dental cream Take 1 application. by mouth. Twice weekly    ? fluticasone (FLONASE) 50 MCG/ACT nasal spray 2 sprays daily.    ? gabapentin (NEURONTIN) 100 MG capsule Take 1 capsule by mouth daily.    ? hydrochlorothiazide (MICROZIDE) 12.5 MG capsule Take 12.5 mg by mouth daily.    ? KLOR-CON M20 20 MEQ tablet Take 2 tablets (40 mEq total) by mouth daily. 30 tablet 1  ? LORazepam (ATIVAN) 0.5 MG tablet Take 0.5 mg by mouth every 8 (eight) hours as needed for anxiety.    ? metoprolol succinate (TOPROL-XL) 100 MG 24 hr tablet Take 50-100 mg by mouth daily.    ?  rosuvastatin (CRESTOR) 10 MG tablet Take 1 tablet (10 mg total) by mouth at bedtime. 30 tablet 2  ? vitamin B-12 (CYANOCOBALAMIN) 1000 MCG tablet Take 2,000 mcg by mouth at bedtime.    ? losartan (COZAAR) 100 MG tablet Take 50 mg by mouth daily.    ? metoprolol succinate (TOPROL-XL) 50 MG 24 hr tablet TAKE 1 TABLET (50 MG TOTAL) BY MOUTH DAILY. NEED OV. (Patient taking differently: Take 50 mg by mouth daily.) 90 tablet 0  ? nortriptyline (PAMELOR) 25 MG capsule Take 1 capsule (25 mg total) by mouth at bedtime.    ? ?No facility-administered medications prior to visit.  ? ?Past Medical History:  ?Diagnosis Date  ? Anxiety   ? GERD (gastroesophageal reflux disease)   ? Hypercholesteremia   ? Hypertension   ? Hypokalemia   ? Pulmonary nodules   ? TIA (transient ischemic attack)   ? PT denies  ? ?Past Surgical History:  ?Procedure Laterality Date  ? KNEE ARTHROSCOPY WITH MEDIAL MENISECTOMY Left 04/30/2015  ? Procedure: LEFT KNEE ARTHROSCOPY WITH PARTIAL MEDIAL MENISECTOMY;  Surgeon: Darreld Mclean, MD;  Location: [facility];  Service: Orthopedics;  Laterality: Left;  ? NO PAST SURGERIES    ? ?Social History  ? ?Socioeconomic History  ? Marital status: Married  ?  Spouse name: Not on file  ? Number of children: 1  ? Years of education: Not on  file  ? Highest education level: Not on file  ?Occupational History  ? Occupation: Retired  ?Tobacco Use  ? Smoking status: Former  ?  Packs/day: 1.00  ?  Years: 40.00  ?  Pack years: 40.00  ?  Types: Cigarettes  ?  Quit date: 04/12/2007  ?  Years since quitting: 14.9  ? Smokeless tobacco: Never  ?Vaping Use  ? Vaping Use: Never used  ?Substance and Sexual Activity  ? Alcohol use: Not Currently  ?  Comment: occasional beer   ? Drug use: No  ? Sexual activity: Never  ?  Birth control/protection: None  ?Other Topics Concern  ? Not on file  ?Social History Narrative  ? Not on file  ? ?Social Determinants of Health  ? ?Financial Resource Strain: Not on file  ?Food Insecurity: Not on file   ?Transportation Needs: Not on file  ?Physical Activity: Not on file  ?Stress: Not on file  ?Social Connections: Not on file  ? ?Family History  ?Problem Relation Age of Onset  ? Asthma Mother   ? Heart disease Mother   ? Dementia Mother   ? Heart disease Sister   ? Arthritis Sister   ? Heart failure Brother   ? Diabetes Maternal Grandmother   ? Dementia Maternal Aunt   ? Dementia Maternal Uncle   ? Colon cancer Neg Hx   ? Stomach cancer Neg Hx   ? Esophageal cancer Neg Hx   ? Colon polyps Neg Hx   ? ?  ? ?Review of Systems: Pertinent positive and negative review of systems were noted in the above HPI section. All other review of systems were otherwise negative. ? ? ?Physical Exam: ?General: Well developed, well nourished, no acute distress ?Head: Normocephalic and atraumatic ?Eyes: Sclerae anicteric, EOMI ?Ears: Normal auditory acuity ?Mouth: Not examined, mask on during Covid-19 pandemic ?Neck: Supple, no masses or thyromegaly ?Lungs: Clear throughout to auscultation ?Heart: Regular rate and rhythm; no murmurs, rubs or bruits ?Abdomen: Soft, non tender and non distended. No masses, hepatosplenomegaly or hernias noted. Normal Bowel sounds ?Rectal: Not done ?Musculoskeletal: Symmetrical with no gross deformities  ?Skin: No lesions on visible extremities ?Pulses:  Normal pulses noted ?Extremities: No clubbing, cyanosis, edema or deformities noted ?Neurological: Alert oriented x 4, grossly nonfocal ?Cervical Nodes:  No significant cervical adenopathy ?Inguinal Nodes: No significant inguinal adenopathy ?Psychological:  Alert and cooperative. Normal mood and affect ? ? ?Assessment and Recommendations: ? ?Solid food dysphagia and GERD.  Rule out esophageal stricture, esophagitis, Barrett's and other disorders.  Begin pantoprazole 40 mg p.o. every morning.  Follow antireflux measures.  Schedule EGD with possible dilation. The risks (including bleeding, perforation, infection, missed lesions, medication reactions and  possible hospitalization or surgery if complications occur), benefits, and alternatives to endoscopy with possible biopsy and possible dilation were discussed with the patient and they consent to proceed.   ?CRC screening, average risk he is due for colonoscopy in several months and we will defer until his upper GI symptoms are under good control. ? ?

## 2022-04-06 ENCOUNTER — Ambulatory Visit (HOSPITAL_COMMUNITY)
Admission: RE | Admit: 2022-04-06 | Discharge: 2022-04-06 | Disposition: A | Discharge status: Home / Self Care | Payer: Medicare Other | Source: Ambulatory Visit | Attending: Gastroenterology | Admitting: Gastroenterology

## 2022-04-06 DIAGNOSIS — R1310 Dysphagia, unspecified: Secondary | ICD-10-CM | POA: Diagnosis present

## 2022-04-06 DIAGNOSIS — K219 Gastro-esophageal reflux disease without esophagitis: Secondary | ICD-10-CM | POA: Insufficient documentation

## 2022-04-13 ENCOUNTER — Other Ambulatory Visit (HOSPITAL_COMMUNITY): Payer: Self-pay

## 2022-04-13 ENCOUNTER — Other Ambulatory Visit: Payer: Self-pay

## 2022-04-13 DIAGNOSIS — R1310 Dysphagia, unspecified: Secondary | ICD-10-CM

## 2022-04-13 DIAGNOSIS — K219 Gastro-esophageal reflux disease without esophagitis: Secondary | ICD-10-CM

## 2022-04-13 DIAGNOSIS — R059 Cough, unspecified: Secondary | ICD-10-CM

## 2022-04-15 ENCOUNTER — Telehealth: Payer: Self-pay

## 2022-04-15 NOTE — Telephone Encounter (Signed)
Left message for Marvin Bender with Rehab x2 to call back in regards to appointment for tomorrow.  ?

## 2022-04-15 NOTE — Telephone Encounter (Signed)
Spoke with patient & wife and they are unable to make it to the [facility] tomorrow 4/27 at 11:00. Speech therapist notified. They would prefer to wait to rescheduled once he has had his endoscopy next week. Staff reminder sent to self to call and schedule [facility] once he has completed procedure. ?

## 2022-04-16 ENCOUNTER — Encounter (HOSPITAL_COMMUNITY): Payer: Medicare Other

## 2022-04-16 ENCOUNTER — Ambulatory Visit (HOSPITAL_COMMUNITY): Payer: Medicare Other

## 2022-04-23 ENCOUNTER — Encounter: Payer: Self-pay | Admitting: Gastroenterology

## 2022-04-23 ENCOUNTER — Ambulatory Visit (AMBULATORY_SURGERY_CENTER): Payer: Medicare Other | Admitting: Gastroenterology

## 2022-04-23 VITALS — BP 115/72 | HR 52 | Temp 98.0°F | Resp 12 | Ht 73.0 in | Wt 264.0 lb

## 2022-04-23 DIAGNOSIS — R1310 Dysphagia, unspecified: Secondary | ICD-10-CM

## 2022-04-23 DIAGNOSIS — K219 Gastro-esophageal reflux disease without esophagitis: Secondary | ICD-10-CM | POA: Diagnosis not present

## 2022-04-23 DIAGNOSIS — K319 Disease of stomach and duodenum, unspecified: Secondary | ICD-10-CM | POA: Diagnosis not present

## 2022-04-23 DIAGNOSIS — K222 Esophageal obstruction: Secondary | ICD-10-CM | POA: Diagnosis not present

## 2022-04-23 MED ORDER — SODIUM CHLORIDE 0.9 % IV SOLN: 500.0000 mL | Freq: Once | INTRAVENOUS | Status: DC

## 2022-04-23 NOTE — Progress Notes (Signed)
Report to PACU, RN, vss, BBS= Clear.  

## 2022-04-23 NOTE — Progress Notes (Signed)
? ?History & Physical ? ?Primary Care Physician:  System, Provider Not In ?Primary Gastroenterologist: Claudette Head, MD ? ?CHIEF COMPLAINT: Dysphagia, GERD ? ?HPI: Marvin Bender is a 66 y.o. male with dysphagia and GERD for EGD, possible dilation. ? ? ?Past Medical History:  ?Diagnosis Date  ? Anxiety   ? GERD (gastroesophageal reflux disease)   ? Hypercholesteremia   ? Hypertension   ? Hypokalemia   ? Pulmonary nodules   ? TIA (transient ischemic attack)   ? PT denies  ? ? ?Past Surgical History:  ?Procedure Laterality Date  ? KNEE ARTHROSCOPY WITH MEDIAL MENISECTOMY Left 04/30/2015  ? Procedure: LEFT KNEE ARTHROSCOPY WITH PARTIAL MEDIAL MENISECTOMY;  Surgeon: Darreld Mclean, MD;  Location: [facility];  Service: Orthopedics;  Laterality: Left;  ? NO PAST SURGERIES    ? ? ?Prior to Admission medications   ?Medication Sig Start Date End Date Taking? Authorizing Provider  ?albuterol (PROVENTIL HFA;VENTOLIN HFA) 108 (90 Base) MCG/ACT inhaler Inhale 2 puffs into the lungs every 6 (six) hours as needed for wheezing or shortness of breath. 02/22/17  Yes Dettinger, Elige Radon, MD  ?aspirin EC 81 MG tablet Take 1 tablet (81 mg total) by mouth daily. Swallow whole. 09/05/21 09/05/22 Yes Vassie Loll, MD  ?cetirizine (ZYRTEC) 10 MG tablet TAKE 1 TABLET BY MOUTH EVERY DAY 06/02/19  Yes Delynn Flavin M, DO  ?citalopram (CELEXA) 10 MG tablet Take 10 mg by mouth daily. 03/05/22  Yes [provider]  ?DENTA 5000 PLUS 1.1 % CREA dental cream Take 1 application. by mouth. Twice weekly 02/07/22  Yes [provider]  ?fluticasone (FLONASE) 50 MCG/ACT nasal spray 2 sprays daily.   Yes [provider]  ?gabapentin (NEURONTIN) 100 MG capsule Take 1 capsule by mouth daily. 03/03/22  Yes [provider]  ?hydrochlorothiazide (MICROZIDE) 12.5 MG capsule Take 12.5 mg by mouth daily. 02/19/22  Yes [provider]  ?KLOR-CON M20 20 MEQ tablet Take 2 tablets (40 mEq total) by mouth daily. 09/05/21  Yes Vassie Loll, MD  ?LORazepam (ATIVAN) 0.5 MG tablet Take 0.5 mg by mouth every 8 (eight) hours as needed for anxiety.   Yes [provider]  ?metoprolol succinate (TOPROL-XL) 100 MG 24 hr tablet Take 50-100 mg by mouth daily. 12/08/21  Yes [provider]  ?pantoprazole (PROTONIX) 40 MG tablet Take 1 tablet (40 mg total) by mouth daily. 03/19/22  Yes Meryl Dare, MD  ?rosuvastatin (CRESTOR) 10 MG tablet Take 1 tablet (10 mg total) by mouth at bedtime. 09/05/21  Yes Vassie Loll, MD  ?vitamin B-12 (CYANOCOBALAMIN) 1000 MCG tablet Take 2,000 mcg by mouth at bedtime.   Yes [provider]  ? ? ?Current Outpatient Medications  ?Medication Sig Dispense Refill  ? albuterol (PROVENTIL HFA;VENTOLIN HFA) 108 (90 Base) MCG/ACT inhaler Inhale 2 puffs into the lungs every 6 (six) hours as needed for wheezing or shortness of breath. 1 Inhaler 0  ? aspirin EC 81 MG tablet Take 1 tablet (81 mg total) by mouth daily. Swallow whole. 150 tablet 2  ? cetirizine (ZYRTEC) 10 MG tablet TAKE 1 TABLET BY MOUTH EVERY DAY 90 tablet 0  ? citalopram (CELEXA) 10 MG tablet Take 10 mg by mouth daily.    ? DENTA 5000 PLUS 1.1 % CREA dental cream Take 1 application. by mouth. Twice weekly    ? fluticasone (FLONASE) 50 MCG/ACT nasal spray 2 sprays daily.    ? gabapentin (NEURONTIN) 100 MG capsule Take 1 capsule by mouth daily.    ?  hydrochlorothiazide (MICROZIDE) 12.5 MG capsule Take 12.5 mg by mouth daily.    ? KLOR-CON M20 20 MEQ tablet Take 2 tablets (40 mEq total) by mouth daily. 30 tablet 1  ? LORazepam (ATIVAN) 0.5 MG tablet Take 0.5 mg by mouth every 8 (eight) hours as needed for anxiety.    ? metoprolol succinate (TOPROL-XL) 100 MG 24 hr tablet Take 50-100 mg by mouth daily.    ? pantoprazole (PROTONIX) 40 MG tablet Take 1 tablet (40 mg total) by mouth daily. 30 tablet 11  ? rosuvastatin (CRESTOR) 10 MG tablet Take 1 tablet (10 mg total) by mouth at bedtime. 30 tablet 2  ? vitamin B-12 (CYANOCOBALAMIN) 1000 MCG  tablet Take 2,000 mcg by mouth at bedtime.    ? ?Current Facility-Administered Medications  ?Medication Dose Route Frequency Provider Last Rate Last Admin  ? 0.9 %  sodium chloride infusion  500 mL Intravenous Once Meryl Dare, MD      ? ? ?Allergies as of 04/23/2022 - Review Complete 04/23/2022  ?Allergen Reaction Noted  ? Duloxetine Diarrhea and Other (See Comments) 09/18/2016  ? ? ?Family History  ?Problem Relation Age of Onset  ? Asthma Mother   ? Heart disease Mother   ? Dementia Mother   ? Heart disease Sister   ? Arthritis Sister   ? Heart failure Brother   ? Diabetes Maternal Grandmother   ? Dementia Maternal Aunt   ? Dementia Maternal Uncle   ? Colon cancer Neg Hx   ? Stomach cancer Neg Hx   ? Esophageal cancer Neg Hx   ? Colon polyps Neg Hx   ? ? ?Social History  ? ?Socioeconomic History  ? Marital status: Married  ?  Spouse name: Not on file  ? Number of children: 1  ? Years of education: Not on file  ? Highest education level: Not on file  ?Occupational History  ? Occupation: Retired  ?Tobacco Use  ? Smoking status: Former  ?  Packs/day: 1.00  ?  Years: 40.00  ?  Pack years: 40.00  ?  Types: Cigarettes  ?  Quit date: 04/12/2007  ?  Years since quitting: 15.0  ? Smokeless tobacco: Never  ?Vaping Use  ? Vaping Use: Never used  ?Substance and Sexual Activity  ? Alcohol use: Not Currently  ?  Comment: occasional beer   ? Drug use: No  ? Sexual activity: Never  ?  Birth control/protection: None  ?Other Topics Concern  ? Not on file  ?Social History Narrative  ? Not on file  ? ?Social Determinants of Health  ? ?Financial Resource Strain: Not on file  ?Food Insecurity: Not on file  ?Transportation Needs: Not on file  ?Physical Activity: Not on file  ?Stress: Not on file  ?Social Connections: Not on file  ?Intimate Partner Violence: Not on file  ? ? ?Review of Systems: ? ?All systems reviewed an negative except where noted in HPI. ? ?Gen: Denies any fever, chills, sweats, anorexia, fatigue, weakness,  malaise, weight loss, and sleep disorder ?CV: Denies chest pain, angina, palpitations, syncope, orthopnea, PND, peripheral edema, and claudication. ?Resp: Denies dyspnea at rest, dyspnea with exercise, cough, sputum, wheezing, coughing up blood, and pleurisy. ?GI: Denies vomiting blood, jaundice, and fecal incontinence.   Denies dysphagia or odynophagia. ?GU : Denies urinary burning, blood in urine, urinary frequency, urinary hesitancy, nocturnal urination, and urinary incontinence. ?MS: Denies joint pain, limitation of movement, and swelling, stiffness, low back pain, extremity pain. Denies muscle weakness,  cramps, atrophy.  ?Derm: Denies rash, itching, dry skin, hives, moles, warts, or unhealing ulcers.  ?Psych: Denies depression, anxiety, memory loss, suicidal ideation, hallucinations, paranoia, and confusion. ?Heme: Denies bruising, bleeding, and enlarged lymph nodes. ?Neuro:  Denies any headaches, dizziness, paresthesias. ?Endo:  Denies any problems with DM, thyroid, adrenal function. ? ? ?Physical Exam: ?General:  Alert, well-developed, in NAD ?Head:  Normocephalic and atraumatic. ?Eyes:  Sclera clear, no icterus.   Conjunctiva pink. ?Ears:  Normal auditory acuity. ?Mouth:  No deformity or lesions.  ?Neck:  Supple; no masses . ?Lungs:  Clear throughout to auscultation.   No wheezes, crackles, or rhonchi. No acute distress. ?Heart:  Regular rate and rhythm; no murmurs. ?Abdomen:  Soft, nondistended, nontender. No masses, hepatomegaly. No obvious masses.  Normal bowel .    ?Rectal:  Deferred   ?Msk:  Symmetrical without gross deformities.Marland Kitchen. ?Pulses:  Normal pulses noted. ?Extremities:  Without edema. ?Neurologic:  Alert and  oriented x4;  grossly normal neurologically. ?Skin:  Intact without significant lesions or rashes. ?Cervical Nodes:  No significant cervical adenopathy. ?Psych:  Alert and cooperative. Normal mood and affect. ? ? ? ?Impression / Plan:  ? ?Dysphagia and GERD for EGD, possible  dilation. ? ?Marvin Bender  04/23/2022, 9:53 AM ?See Loretha StaplerAMION, [facility], to contact our on call provider ? ? ?  ?

## 2022-04-23 NOTE — Patient Instructions (Signed)
Information on gastritis given to you today. ? ?Await pathology results from the biopsies taken today. ? ?Dilation diet today - see handout given to you. ? ?Continue present medications. ? ?Return to GI office in 1 year. ? ? ?YOU HAD AN ENDOSCOPIC PROCEDURE TODAY AT THE [facility]:   Refer to the procedure report that was given to you for any specific questions about what was found during the examination.  If the procedure report does not answer your questions, please call your gastroenterologist to clarify.  If you requested that your care partner not be given the details of your procedure findings, then the procedure report has been included in a sealed envelope for you to review at your convenience later. ? ?YOU SHOULD EXPECT: Some feelings of bloating in the abdomen. Passage of more gas than usual.  Walking can help get rid of the air that was put into your GI tract during the procedure and reduce the bloating. If you had a lower endoscopy (such as a colonoscopy or flexible sigmoidoscopy) you may notice spotting of blood in your stool or on the toilet paper. If you underwent a bowel prep for your procedure, you may not have a normal bowel movement for a few days. ? ?Please Note:  You might notice some irritation and congestion in your nose or some drainage.  This is from the oxygen used during your procedure.  There is no need for concern and it should clear up in a day or so. ? ?SYMPTOMS TO REPORT IMMEDIATELY: ? ?Following upper endoscopy (EGD) ? Vomiting of blood or coffee ground material ? New chest pain or pain under the shoulder blades ? Painful or persistently difficult swallowing ? New shortness of breath ? Fever of 100?F or higher ? Black, tarry-looking stools ? ?For urgent or emergent issues, a gastroenterologist can be reached at any hour by calling [phone]. ?Do not use MyChart messaging for urgent concerns.  ? ? ?DIET:  We do recommend a small meal at first, but then you may  proceed to your regular diet.  Drink plenty of fluids but you should avoid alcoholic beverages for 24 hours. ? ?ACTIVITY:  You should plan to take it easy for the rest of today and you should NOT DRIVE or use heavy machinery until tomorrow (because of the sedation medicines used during the test).   ? ?FOLLOW UP: ?Our staff will call the number listed on your records 48-72 hours following your procedure to check on you and address any questions or concerns that you may have regarding the information given to you following your procedure. If we do not reach you, we will leave a message.  We will attempt to reach you two times.  During this call, we will ask if you have developed any symptoms of COVID 19. If you develop any symptoms (ie: fever, flu-like symptoms, shortness of breath, cough etc.) before then, please call [phone].  If you test positive for Covid 19 in the 2 weeks post procedure, please call and report this information to [place].   ? ?If any biopsies were taken you will be contacted by phone or by letter within the next 1-3 weeks.  Please call us at [phone] if you have not heard about the biopsies in 3 weeks.  ? ? ?SIGNATURES/CONFIDENTIALITY: ?You and/or your care partner have signed paperwork which will be entered into your electronic medical record.  These signatures attest to the fact that that the information above on your  After Visit Summary has been reviewed and is understood.  Full responsibility of the confidentiality of this discharge information lies with you and/or your care-partner.  ?

## 2022-04-23 NOTE — Op Note (Signed)
[facility] ?Patient Name: Marvin Bender ?Procedure Date: 04/23/2022 9:55 AM ?MRN: [id number] ?Endoscopist: Meryl Dare , MD ?Age: 66 ?Referring MD:  ?Date of Birth: 05-25-1956 ?Gender: Male ?Account #: [id number] ?Procedure:                Upper GI endoscopy ?Indications:              Dysphagia, Gastroesophageal reflux disease ?Medicines:                Monitored Anesthesia Care ?Procedure:                Pre-Anesthesia Assessment: ?                          - Prior to the procedure, a History and Physical  ?                          was performed, and patient medications and  ?                          allergies were reviewed. The patient's tolerance of  ?                          previous anesthesia was also reviewed. The risks  ?                          and benefits of the procedure and the sedation  ?                          options and risks were discussed with the patient.  ?                          All questions were answered, and informed consent  ?                          was obtained. Prior Anticoagulants: The patient has  ?                          taken no previous anticoagulant or antiplatelet  ?                          agents. ASA Grade Assessment: II - A patient with  ?                          mild systemic disease. After reviewing the risks  ?                          and benefits, the patient was deemed in  ?                          satisfactory condition to undergo the procedure. ?                          After obtaining informed consent, the endoscope was  ?  passed under direct vision. Throughout the  ?                          procedure, the patient's blood pressure, pulse, and  ?                          oxygen saturations were monitored continuously. The  ?                          GIF [id number] #[id number] was introduced through the  ?                          mouth, and advanced to the second part of duodenum.  ?                          The upper GI  endoscopy was accomplished without  ?                          difficulty. The patient tolerated the procedure  ?                          well. ?Scope In: ?Scope Out: ?Findings:                 One benign-appearing, intrinsic mild stenosis was  ?                          found in the distal esophagus. This stenosis  ?                          measured 1.4 cm (inner diameter) x less than one cm  ?                          (in length). The stenosis was traversed. A  ?                          guidewire was placed and the scope was withdrawn.  ?                          Dilations were performed with Savary dilators with  ?                          mild resistance at 16 mm and 17 mm. Trace heme on  ?                          17 mm dilator. ?                          The exam of the esophagus was otherwise normal. ?                          Patchy moderately erythematous mucosa without  ?  bleeding was found in the gastric body and in the  ?                          gastric antrum. Biopsies were taken with a cold  ?                          forceps for histology. ?                          The exam of the stomach was otherwise normal. ?                          The duodenal bulb and second portion of the  ?                          duodenum were normal. ?Complications:            No immediate complications. ?Estimated Blood Loss:     Estimated blood loss was minimal. ?Impression:               - Benign-appearing esophageal stenosis. Dilated. ?                          - Erythematous mucosa in the gastric body and  ?                          antrum. Biopsied. ?                          - Normal duodenal bulb and second portion of the  ?                          duodenum. ?Recommendation:           - Patient has a contact number available for  ?                          emergencies. The signs and symptoms of potential  ?                          delayed complications were discussed with the  ?                           patient. Return to normal activities tomorrow.  ?                          Written discharge instructions were provided to the  ?                          patient. ?                          - Clear liquid diet for 2 hours, then advance as  ?                          tolerated to soft diet today. ?                          -  Resume prior diet tomorrow. ?                          - Follow antireflux measures. ?                          - Continue present medications. ?                          - Await pathology results. ?                          - Return to GI office in 1 year. ?Meryl Dare, MD ?04/23/2022 10:16:09 AM ?This report has been signed electronically. ?

## 2022-04-23 NOTE — Progress Notes (Signed)
Called to room to assist during endoscopic procedure.  Patient ID and intended procedure confirmed with present staff. Received instructions for my participation in the procedure from the performing physician.  

## 2022-04-23 NOTE — Progress Notes (Signed)
I have reviewed the patient's medical history in detail and updated the computerized patient record.   VS BY CW. 

## 2022-04-27 ENCOUNTER — Telehealth: Payer: Self-pay | Admitting: *Deleted

## 2022-04-27 NOTE — Telephone Encounter (Signed)
?  Follow up Call- ? ? ?  04/23/2022  ?  9:00 AM  ?Call back number  ?Post procedure Call Back phone  # [phone]  ?Permission to leave phone message Yes  ?  ? ?Patient questions: ? ?Do you have a fever, pain , or abdominal swelling? No. ?Pain Score  0 * ? ?Have you tolerated food without any problems? Yes.   ? ?Have you been able to return to your normal activities? Yes.   ? ?Do you have any questions about your discharge instructions: ?Diet   No. ?Medications  No. ?Follow up visit  No. ? ?Do you have questions or concerns about your Care? No. ? ?Actions: ?* If pain score is 4 or above: ?No action needed, pain <4. ? ? ?

## 2022-05-04 ENCOUNTER — Encounter: Payer: Self-pay | Admitting: Gastroenterology

## 2022-05-29 ENCOUNTER — Encounter: Payer: Self-pay | Admitting: Gastroenterology

## 2022-10-14 NOTE — H&P (Signed)
Surgical History & Physical  Patient Name: Marvin Bender DOB: 01/01/1956  Surgery: Cataract extraction with intraocular lens implant phacoemulsification; Left Eye  Surgeon: Baruch Goldmann MD Surgery Date:  11-02-22 Pre-Op Date:  10-05-22  HPI: A 73 Yr. old male patient is referred by Dr Wynetta Emery for cataract eval. 1. The patient complains of blurry vision, which began 1 year ago. Both eyes are affected OS>OD. The episode is gradual. The condition's severity increased since last visit. Symptoms occur when the patient is driving, inside and outside. This is negatively affecting the patient's quality of life and the patient is unable to function adequately in life with the current level of vision. HPI was performed by Baruch Goldmann .  Medical History: Anxiety/Depression, GERD High Blood Pressure LDL  Review of Systems Negative Allergic/Immunologic Negative Cardiovascular Negative Constitutional Negative Ear, Nose, Mouth & Throat Negative Endocrine Negative Eyes Negative Gastrointestinal Negative Genitourinary Negative Hemotologic/Lymphatic Negative Integumentary Negative Musculoskeletal Negative Neurological Negative Psychiatry Negative Respiratory  Social   Former smoker   Medication  Atorvastatin, HCTZ, Zyrtec, Cetirizine, Klor-Con, Gabapentin, Lorazepam, Pantoprazole,   Sx/Procedures Knee sx left,   Drug Allergies   NKDA  History & Physical: Heent: Cataract, left eye NECK: supple without bruits LUNGS: lungs clear to auscultation CV: regular rate and rhythm Abdomen: soft and non-tender Impression & Plan: Assessment: 1.  COMBINED FORMS AGE RELATED CATARACT; Both Eyes (H25.813) 2.  BLEPHARITIS; Right Upper Lid, Right Lower Lid, Left Upper Lid, Left Lower Lid (H01.001, H01.002,H01.004,H01.005) 3.  Pinguecula; Both Eyes (H11.153)  Plan: 1.  Cataract accounts for the patient's decreased vision. This visual impairment is not correctable with a tolerable change in glasses  or contact lenses. Cataract surgery with an implantation of a new lens should significantly improve the visual and functional status of the patient. Discussed all risks, benefits, alternatives, and potential complications. Discussed the procedures and recovery. Patient desires to have surgery. A-scan ordered and performed today for intra-ocular lens calculations. The surgery will be performed in order to improve vision for driving, reading, and for eye examinations. Recommend phacoemulsification with intra-ocular lens. Recommend Dextenza for post-operative pain and inflammation. Left Eye worse- first. Dilates poorly - shugarcaine by protocol. Malyugin Ring. Omidira.  2.  Recommend regular lid cleaning.  3.  Observe; Artificial tears as needed for irritation.

## 2022-10-27 ENCOUNTER — Encounter (HOSPITAL_COMMUNITY): Payer: Self-pay

## 2022-10-27 ENCOUNTER — Other Ambulatory Visit: Payer: Self-pay

## 2022-10-28 ENCOUNTER — Encounter (HOSPITAL_COMMUNITY)
Admission: RE | Admit: 2022-10-28 | Discharge: 2022-10-28 | Disposition: A | Discharge status: Home / Self Care | Payer: Medicare Other | Source: Ambulatory Visit | Attending: Ophthalmology | Admitting: Ophthalmology

## 2022-11-02 ENCOUNTER — Encounter (HOSPITAL_COMMUNITY): Payer: Self-pay | Admitting: Ophthalmology

## 2022-11-02 ENCOUNTER — Ambulatory Visit (HOSPITAL_COMMUNITY): Payer: Medicare Other | Admitting: Certified Registered Nurse Anesthetist

## 2022-11-02 ENCOUNTER — Ambulatory Visit (HOSPITAL_COMMUNITY)
Admission: RE | Admit: 2022-11-02 | Discharge: 2022-11-02 | Disposition: A | Discharge status: Home / Self Care | Payer: Medicare Other | Source: Ambulatory Visit | Attending: Ophthalmology | Admitting: Ophthalmology

## 2022-11-02 ENCOUNTER — Ambulatory Visit (HOSPITAL_BASED_OUTPATIENT_CLINIC_OR_DEPARTMENT_OTHER): Payer: Medicare Other | Admitting: Certified Registered Nurse Anesthetist

## 2022-11-02 ENCOUNTER — Encounter (HOSPITAL_COMMUNITY)
Admission: RE | Disposition: A | Discharge status: Home / Self Care | Payer: Self-pay | Source: Ambulatory Visit | Attending: Ophthalmology

## 2022-11-02 DIAGNOSIS — Z87891 Personal history of nicotine dependence: Secondary | ICD-10-CM

## 2022-11-02 DIAGNOSIS — Z79899 Other long term (current) drug therapy: Secondary | ICD-10-CM | POA: Insufficient documentation

## 2022-11-02 DIAGNOSIS — H2181 Floppy iris syndrome: Secondary | ICD-10-CM | POA: Diagnosis not present

## 2022-11-02 DIAGNOSIS — H2512 Age-related nuclear cataract, left eye: Secondary | ICD-10-CM | POA: Diagnosis not present

## 2022-11-02 DIAGNOSIS — K219 Gastro-esophageal reflux disease without esophagitis: Secondary | ICD-10-CM | POA: Diagnosis not present

## 2022-11-02 DIAGNOSIS — I10 Essential (primary) hypertension: Secondary | ICD-10-CM

## 2022-11-02 DIAGNOSIS — F419 Anxiety disorder, unspecified: Secondary | ICD-10-CM | POA: Insufficient documentation

## 2022-11-02 DIAGNOSIS — H25812 Combined forms of age-related cataract, left eye: Secondary | ICD-10-CM | POA: Insufficient documentation

## 2022-11-02 SURGERY — CATARACT EXTRACTION PHACO AND INTRAOCULAR LENS PLACEMENT (IOC) with placement of Corticosteroid
Anesthesia: Monitor Anesthesia Care | Site: Eye | Laterality: Left

## 2022-11-02 MED ORDER — BSS IO SOLN
INTRAOCULAR | Status: DC | PRN
  Administered 2022-11-02: 15 mL via INTRAOCULAR

## 2022-11-02 MED ORDER — STERILE WATER FOR IRRIGATION IR SOLN
Status: DC | PRN
  Administered 2022-11-02: 250 mL

## 2022-11-02 MED ORDER — MOXIFLOXACIN HCL 5 MG/ML IO SOLN
INTRAOCULAR | Status: AC
  Filled 2022-11-02: qty 1

## 2022-11-02 MED ORDER — TROPICAMIDE 1 % OP SOLN
1.0000 [drp] | OPHTHALMIC | Status: AC | PRN
  Administered 2022-11-02 (×3): 1 [drp] via OPHTHALMIC

## 2022-11-02 MED ORDER — MIDAZOLAM HCL 5 MG/5ML IJ SOLN
INTRAMUSCULAR | Status: DC | PRN
  Administered 2022-11-02 (×2): 1 mg via INTRAVENOUS

## 2022-11-02 MED ORDER — DEXAMETHASONE 0.4 MG OP INST
VAGINAL_INSERT | OPHTHALMIC | Status: AC
  Filled 2022-11-02: qty 1

## 2022-11-02 MED ORDER — DEXAMETHASONE 0.4 MG OP INST: VAGINAL_INSERT | OPHTHALMIC | Status: DC | PRN

## 2022-11-02 MED ORDER — MIDAZOLAM HCL 2 MG/2ML IJ SOLN
INTRAMUSCULAR | Status: AC
  Filled 2022-11-02: qty 2

## 2022-11-02 MED ORDER — PHENYLEPHRINE-KETOROLAC 1-0.3 % IO SOLN
INTRAOCULAR | Status: DC | PRN
  Administered 2022-11-02: 500 mL via OPHTHALMIC

## 2022-11-02 MED ORDER — LIDOCAINE HCL 3.5 % OP GEL
1.0000 | Freq: Once | OPHTHALMIC | Status: AC
  Administered 2022-11-02: 1 via OPHTHALMIC

## 2022-11-02 MED ORDER — SODIUM HYALURONATE 23MG/ML IO SOSY
PREFILLED_SYRINGE | INTRAOCULAR | Status: DC | PRN
  Administered 2022-11-02: .6 mL via INTRAOCULAR

## 2022-11-02 MED ORDER — EPINEPHRINE PF 1 MG/ML IJ SOLN
INTRAMUSCULAR | Status: AC
  Filled 2022-11-02: qty 1

## 2022-11-02 MED ORDER — SODIUM HYALURONATE 10 MG/ML IO SOLUTION
PREFILLED_SYRINGE | INTRAOCULAR | Status: DC | PRN
  Administered 2022-11-02: .85 mL via INTRAOCULAR

## 2022-11-02 MED ORDER — POVIDONE-IODINE 5 % OP SOLN
OPHTHALMIC | Status: DC | PRN
  Administered 2022-11-02: 1 via OPHTHALMIC

## 2022-11-02 MED ORDER — PHENYLEPHRINE HCL 2.5 % OP SOLN
1.0000 [drp] | OPHTHALMIC | Status: AC | PRN
  Administered 2022-11-02 (×3): 1 [drp] via OPHTHALMIC

## 2022-11-02 MED ORDER — TETRACAINE HCL 0.5 % OP SOLN
1.0000 [drp] | OPHTHALMIC | Status: AC | PRN
  Administered 2022-11-02 (×3): 1 [drp] via OPHTHALMIC

## 2022-11-02 MED ORDER — PHENYLEPHRINE-KETOROLAC 1-0.3 % IO SOLN
INTRAOCULAR | Status: AC
  Filled 2022-11-02: qty 4

## 2022-11-02 MED ORDER — MOXIFLOXACIN HCL 0.5 % OP SOLN
OPHTHALMIC | Status: DC | PRN
  Administered 2022-11-02: .2 mL via OPHTHALMIC

## 2022-11-02 MED ORDER — LIDOCAINE HCL (PF) 1 % IJ SOLN
INTRAOCULAR | Status: DC | PRN
  Administered 2022-11-02: 1 mL via OPHTHALMIC

## 2022-11-02 SURGICAL SUPPLY — 17 items
CATARACT SUITE SIGHTPATH (MISCELLANEOUS) ×1 IMPLANT
CLOTH BEACON ORANGE TIMEOUT ST (SAFETY) ×1 IMPLANT
DRAPE HALF SHEET 40X57 (DRAPES) IMPLANT
EYE SHIELD UNIVERSAL CLEAR (GAUZE/BANDAGES/DRESSINGS) IMPLANT
FEE CATARACT SUITE SIGHTPATH (MISCELLANEOUS) ×1 IMPLANT
GLOVE BIOGEL PI IND STRL 6.5 (GLOVE) IMPLANT
GLOVE BIOGEL PI IND STRL 7.0 (GLOVE) ×2 IMPLANT
LENS IOL RAYNER 19.0 (Intraocular Lens) ×1 IMPLANT
LENS IOL RAYONE EMV 19.0 (Intraocular Lens) IMPLANT
NDL HYPO 18GX1.5 BLUNT FILL (NEEDLE) ×1 IMPLANT
NEEDLE HYPO 18GX1.5 BLUNT FILL (NEEDLE) ×1 IMPLANT
PAD ARMBOARD 7.5X6 YLW CONV (MISCELLANEOUS) ×1 IMPLANT
RING MALYGIN 7.0 (MISCELLANEOUS) IMPLANT
SYR TB 1ML LL NO SAFETY (SYRINGE) ×1 IMPLANT
TAPE SURG TRANSPORE 1 IN (GAUZE/BANDAGES/DRESSINGS) IMPLANT
TAPE SURGICAL TRANSPORE 1 IN (GAUZE/BANDAGES/DRESSINGS) ×1
WATER STERILE IRR 250ML POUR (IV SOLUTION) ×1 IMPLANT

## 2022-11-02 NOTE — Anesthesia Preprocedure Evaluation (Signed)
Anesthesia Evaluation  Patient identified by MRN, date of birth, ID band Patient awake    Reviewed: Allergy & Precautions, H&P , NPO status , Patient's Chart, lab work & pertinent test results, reviewed documented beta blocker date and time   Airway Mallampati: II  TM Distance: >3 FB Neck ROM: full    Dental no notable dental hx.    Pulmonary neg pulmonary ROS, former smoker   Pulmonary exam normal breath sounds clear to auscultation       Cardiovascular Exercise Tolerance: Good hypertension, + DOE   Rhythm:regular Rate:Normal     Neuro/Psych  Headaches  Anxiety     TIA negative psych ROS   GI/Hepatic Neg liver ROS,GERD  Medicated,,  Endo/Other  negative endocrine ROS    Renal/GU negative Renal ROS  negative genitourinary   Musculoskeletal negative musculoskeletal ROS (+)    Abdominal   Peds negative pediatric ROS (+)  Hematology negative hematology ROS (+)   Anesthesia Other Findings   Reproductive/Obstetrics negative OB ROS                             Anesthesia Physical Anesthesia Plan  ASA: 3  Anesthesia Plan: MAC   Post-op Pain Management:    Induction:   PONV Risk Score and Plan:   Airway Management Planned:   Additional Equipment:   Intra-op Plan:   Post-operative Plan:   Informed Consent: I have reviewed the patients History and Physical, chart, labs and discussed the procedure including the risks, benefits and alternatives for the proposed anesthesia with the patient or authorized representative who has indicated his/her understanding and acceptance.     Dental Advisory Given  Plan Discussed with: CRNA  Anesthesia Plan Comments:        Anesthesia Quick Evaluation

## 2022-11-02 NOTE — Anesthesia Postprocedure Evaluation (Signed)
Anesthesia Post Note  Patient: Marvin Bender  Procedure(s) Performed: CATARACT EXTRACTION PHACO AND INTRAOCULAR LENS PLACEMENT (IOC) (Left: Eye)  Patient location during evaluation: Phase II Anesthesia Type: MAC Level of consciousness: awake Pain management: pain level controlled Vital Signs Assessment: post-procedure vital signs reviewed and stable Respiratory status: spontaneous breathing and respiratory function stable Cardiovascular status: blood pressure returned to baseline and stable Postop Assessment: no headache and no apparent nausea or vomiting Anesthetic complications: no Comments: Late entry   No notable events documented.   Last Vitals:  Vitals:   11/02/22 0920 11/02/22 1042  BP: 120/73 (!) 131/95  Pulse: (!) 59 66  Resp: 14 18  Temp: 36.7 C 36.9 C  SpO2: 95% 97%    Last Pain:  Vitals:   11/02/22 1042  TempSrc: Oral  PainSc: 0-No pain                 Louann Sjogren

## 2022-11-02 NOTE — Op Note (Addendum)
Date of procedure: 11/02/22  Pre-operative diagnosis: Visually significant age-related cataract, Left Eye; Poor dilation, Left eye (H25.?2)   Post-operative diagnosis: Visually significant age-related cataract, Left Eye; Intra-operative Floppy Iris Syndrome, Left Eye (H21.81)  Procedure: Complex removal of cataract via phacoemulsification and insertion of intra-ocular lens Rayner [id number] +19.0D into the capsular bag of the Left Eye (CPT [phone])  Attending surgeon: Gerda Diss. Clodagh Odenthal, MD, MA  Anesthesia: MAC, Topical Akten  Complications: None  Estimated Blood Loss: <74m (minimal)  Specimens: None  Implants: As above  Indications:  Visually significant cataract, Left Eye  Procedure:  The patient was seen and identified in the pre-operative area. The operative eye was identified and dilated.  The operative eye was marked.  Topical anesthesia was administered to the operative eye.     The patient was then to the operative suite and placed in the supine position.  A timeout was performed confirming the patient, procedure to be performed, and all other relevant information.   The patient's face was prepped and draped in the usual fashion for intra-ocular surgery.  A lid speculum was placed into the operative eye and the surgical microscope moved into place and focused.  Poor dilation of the iris was confirmed.  An inferotemporal paracentesis was created using a 20 gauge paracentesis blade.  Shugarcaine was injected into the anterior chamber.  Viscoelastic was injected into the anterior chamber.  A temporal clear-corneal main wound incision was created using a 2.43mmicrokeratome.  A Malyugin ring was placed.  A continuous curvilinear capsulorrhexis was initiated using an irrigating cystitome and completed using capsulorrhexis forceps.  Hydrodissection and hydrodeliniation were performed.  Viscoelastic was injected into the anterior chamber.  A phacoemulsification handpiece and a chopper as a second  instrument were used to remove the nucleus and epinucleus. The irrigation/aspiration handpiece was used to remove any remaining cortical material.   The capsular bag was reinflated with viscoelastic, checked, and found to be intact.  The intraocular lens was inserted into the capsular bag and dialed into place using a MaSurveyor, mineralsThe Malyugin ring was removed.  The irrigation/aspiration handpiece was used to remove any remaining viscoelastic.  The clear corneal wound and paracentesis wounds were then hydrated and checked with Weck-Cels to be watertight. 0.74m45mf moxifloxacin was injected into the anterior chamber.  The lid-speculum and drape was removed, and the patient's face was cleaned with a wet and dry 4x4. A clear shield was taped over the eye. The patient was taken to the post-operative care unit in good condition, having tolerated the procedure well.  Post-Op Instructions: The [facility] post-operative evaluation and will receive all other orders and instructions.

## 2022-11-02 NOTE — Discharge Instructions (Signed)
Please discharge patient when stable, will follow up today with Dr. Numair Masden at the [facility] office immediately following discharge.  Leave shield in place until visit.  All paperwork with discharge instructions will be given at the office.  [facility] Address:  [address]  [place], [place] [address]  

## 2022-11-02 NOTE — Interval H&P Note (Signed)
History and Physical Interval Note:  11/02/2022 10:05 AM  Marvin Bender  has presented today for surgery, with the diagnosis of combined forms age related cataract; left eye.  The various methods of treatment have been discussed with the patient and family. After consideration of risks, benefits and other options for treatment, the patient has consented to  Procedure(s) with comments: CATARACT EXTRACTION PHACO AND INTRAOCULAR LENS PLACEMENT (IOC) with placement of Corticosteroid (Left) - CDE as a surgical intervention.  The patient's history has been reviewed, patient examined, no change in status, stable for surgery.  I have reviewed the patient's chart and labs.  Questions were answered to the patient's satisfaction.     Fabio Pierce

## 2022-11-02 NOTE — Transfer of Care (Signed)
Immediate Anesthesia Transfer of Care Note  Patient: Marvin Bender  Procedure(s) Performed: CATARACT EXTRACTION PHACO AND INTRAOCULAR LENS PLACEMENT (IOC) (Left: Eye)  Patient Location: PACU  Anesthesia Type:MAC  Level of Consciousness: awake, alert , and oriented  Airway & Oxygen Therapy: Patient Spontanous Breathing  Post-op Assessment: Report given to RN and Post -op Vital signs reviewed and stable  Post vital signs: Reviewed and stable  Last Vitals:  Vitals Value Taken Time  BP    Temp    Pulse    Resp    SpO2      Last Pain:  Vitals:   11/02/22 0920  TempSrc: Oral  PainSc: 0-No pain         Complications: No notable events documented.

## 2022-11-10 ENCOUNTER — Encounter (HOSPITAL_COMMUNITY)
Admission: RE | Admit: 2022-11-10 | Discharge: 2022-11-10 | Disposition: A | Discharge status: Home / Self Care | Payer: Medicare Other | Source: Ambulatory Visit | Attending: Ophthalmology | Admitting: Ophthalmology

## 2022-11-10 NOTE — H&P (Signed)
Surgical History & Physical  Patient Name: Marvin Bender DOB: [birth year]  Surgery: Cataract extraction with intraocular lens implant phacoemulsification; Right Eye  Surgeon: Fabio Pierce MD Surgery Date:  11-16-22 Pre-Op Date:  11-09-22  HPI: A 36 Yr. old male patient 1. The patient is returning after cataract surgery. The left eye is affected. Status post cataract surgery, which began 1 weeks ago: Since the last visit, the affected area is doing well. Patient is following medication instructions: Combo drop BID OS. Patient also presents for blurred vision in the right eye. Patient has trouble driving in the dark due to significant glare. Vision is imbalanced between the two eyes. This is negatively affecting the patient's quality of life and the patient is unable to function adequately in life with the current level of vision. Eyes have been tearing a lot OU. HPI Completed by Dr. Fabio Pierce  Medical History: Anxiety/Depression, GERD High Blood Pressure LDL  Review of Systems Negative Allergic/Immunologic Negative Cardiovascular Negative Constitutional Negative Ear, Nose, Mouth & Throat Negative Endocrine Negative Eyes Negative Gastrointestinal Negative Genitourinary Negative Hemotologic/Lymphatic Negative Integumentary Negative Musculoskeletal Negative Neurological Negative Psychiatry Negative Respiratory  Social   Former smoker   Medication Prednisolone-Moxifloxacin-Bromfenac,  Atorvastatin, HCTZ, Zyrtec, Cetirizine, Klor-Con, Gabapentin, Lorazepam, Pantoprazole,   Sx/Procedures Phaco c IOL OS,  Knee sx left,   Drug Allergies   NKDA  History & Physical: Heent: Cataract, right eye NECK: supple without bruits LUNGS: lungs clear to auscultation CV: regular rate and rhythm Abdomen: soft and non-tender Impression & Plan: Assessment: 1.  CATARACT EXTRACTION STATUS; Left Eye (Z98.42) 2.  COMBINED FORMS AGE RELATED CATARACT; Right Eye (H25.811) 3.  Myopia ; Left  Eye (H52.12)  Plan: 1.  1 week after cataract surgery. Doing well with improved vision and normal eye pressure. Call with any problems or concerns. Continue Pred-Moxi-Brom 2x/day for 3 more weeks.  2.  Cataract accounts for the patient's decreased vision. This visual impairment is not correctable with a tolerable change in glasses or contact lenses. Cataract surgery with an implantation of a new lens should significantly improve the visual and functional status of the patient. Discussed all risks, benefits, alternatives, and potential complications. Discussed the procedures and recovery. Patient desires to have surgery. A-scan ordered and performed today for intra-ocular lens calculations. The surgery will be performed in order to improve vision for driving, reading, and for eye examinations. Recommend phacoemulsification with intra-ocular lens. Recommend Dextenza for post-operative pain and inflammation. Right Eye. Surgery required to correct imbalance of vision. Dilates poorly - shugarcaine by protocol. Malyugin Ring. Omidira.  3.

## 2022-11-11 ENCOUNTER — Encounter (HOSPITAL_COMMUNITY): Payer: Self-pay

## 2022-11-16 ENCOUNTER — Encounter (HOSPITAL_COMMUNITY)
Admission: RE | Disposition: A | Discharge status: Home / Self Care | Payer: Self-pay | Source: Ambulatory Visit | Attending: Ophthalmology

## 2022-11-16 ENCOUNTER — Ambulatory Visit (HOSPITAL_COMMUNITY)
Admission: RE | Admit: 2022-11-16 | Discharge: 2022-11-16 | Disposition: A | Discharge status: Home / Self Care | Payer: Medicare Other | Source: Ambulatory Visit | Attending: Ophthalmology | Admitting: Ophthalmology

## 2022-11-16 ENCOUNTER — Ambulatory Visit (HOSPITAL_BASED_OUTPATIENT_CLINIC_OR_DEPARTMENT_OTHER): Payer: Medicare Other | Admitting: Anesthesiology

## 2022-11-16 ENCOUNTER — Ambulatory Visit (HOSPITAL_COMMUNITY): Payer: Medicare Other | Admitting: Anesthesiology

## 2022-11-16 DIAGNOSIS — H25811 Combined forms of age-related cataract, right eye: Secondary | ICD-10-CM | POA: Diagnosis not present

## 2022-11-16 DIAGNOSIS — F419 Anxiety disorder, unspecified: Secondary | ICD-10-CM | POA: Diagnosis not present

## 2022-11-16 DIAGNOSIS — K219 Gastro-esophageal reflux disease without esophagitis: Secondary | ICD-10-CM | POA: Diagnosis not present

## 2022-11-16 DIAGNOSIS — I10 Essential (primary) hypertension: Secondary | ICD-10-CM

## 2022-11-16 DIAGNOSIS — H5711 Ocular pain, right eye: Secondary | ICD-10-CM

## 2022-11-16 DIAGNOSIS — Z79899 Other long term (current) drug therapy: Secondary | ICD-10-CM | POA: Diagnosis not present

## 2022-11-16 DIAGNOSIS — Z87891 Personal history of nicotine dependence: Secondary | ICD-10-CM | POA: Insufficient documentation

## 2022-11-16 DIAGNOSIS — H5789 Other specified disorders of eye and adnexa: Secondary | ICD-10-CM

## 2022-11-16 SURGERY — CATARACT EXTRACTION PHACO AND INTRAOCULAR LENS PLACEMENT (IOC) with placement of Corticosteroid
Anesthesia: Monitor Anesthesia Care | Site: Eye | Laterality: Right

## 2022-11-16 MED ORDER — EPINEPHRINE PF 1 MG/ML IJ SOLN
INTRAOCULAR | Status: DC | PRN
  Administered 2022-11-16: 500 mL

## 2022-11-16 MED ORDER — EPINEPHRINE PF 1 MG/ML IJ SOLN
INTRAMUSCULAR | Status: AC
  Filled 2022-11-16: qty 2

## 2022-11-16 MED ORDER — CHLORHEXIDINE GLUCONATE 0.12 % MT SOLN: 15.0000 mL | Freq: Once | OROMUCOSAL | Status: DC

## 2022-11-16 MED ORDER — PHENYLEPHRINE HCL 2.5 % OP SOLN
1.0000 [drp] | OPHTHALMIC | Status: DC | PRN
  Administered 2022-11-16 (×2): 1 [drp] via OPHTHALMIC

## 2022-11-16 MED ORDER — LIDOCAINE HCL 3.5 % OP GEL: 1.0000 | Freq: Once | OPHTHALMIC | Status: DC

## 2022-11-16 MED ORDER — ORAL CARE MOUTH RINSE: 15.0000 mL | Freq: Once | OROMUCOSAL | Status: DC

## 2022-11-16 MED ORDER — SODIUM HYALURONATE 10 MG/ML IO SOLUTION
PREFILLED_SYRINGE | INTRAOCULAR | Status: DC | PRN
  Administered 2022-11-16: .85 mL via INTRAOCULAR

## 2022-11-16 MED ORDER — LIDOCAINE HCL (PF) 1 % IJ SOLN
INTRAOCULAR | Status: DC | PRN
  Administered 2022-11-16: 1 mL via OPHTHALMIC

## 2022-11-16 MED ORDER — TROPICAMIDE 1 % OP SOLN
1.0000 [drp] | OPHTHALMIC | Status: DC | PRN
  Administered 2022-11-16 (×2): 1 [drp] via OPHTHALMIC

## 2022-11-16 MED ORDER — TETRACAINE HCL 0.5 % OP SOLN
1.0000 [drp] | OPHTHALMIC | Status: DC | PRN
  Administered 2022-11-16 (×2): 1 [drp] via OPHTHALMIC

## 2022-11-16 MED ORDER — DEXAMETHASONE 0.4 MG OP INST
VAGINAL_INSERT | OPHTHALMIC | Status: AC
  Filled 2022-11-16: qty 1

## 2022-11-16 MED ORDER — MIDAZOLAM HCL 5 MG/5ML IJ SOLN
INTRAMUSCULAR | Status: DC | PRN
  Administered 2022-11-16: 2 mg via INTRAVENOUS

## 2022-11-16 MED ORDER — STERILE WATER FOR IRRIGATION IR SOLN
Status: DC | PRN
  Administered 2022-11-16: 25 mL

## 2022-11-16 MED ORDER — BSS IO SOLN
INTRAOCULAR | Status: DC | PRN
  Administered 2022-11-16: 15 mL via INTRAOCULAR

## 2022-11-16 MED ORDER — DEXAMETHASONE 0.4 MG OP INST
VAGINAL_INSERT | OPHTHALMIC | Status: DC | PRN
  Administered 2022-11-16: .4 mg via OPHTHALMIC

## 2022-11-16 MED ORDER — LACTATED RINGERS IV SOLN: INTRAVENOUS | Status: DC

## 2022-11-16 MED ORDER — MOXIFLOXACIN HCL 0.5 % OP SOLN
OPHTHALMIC | Status: DC | PRN
  Administered 2022-11-16: .3 mL via OPHTHALMIC

## 2022-11-16 MED ORDER — MIDAZOLAM HCL 2 MG/2ML IJ SOLN
INTRAMUSCULAR | Status: AC
  Filled 2022-11-16: qty 2

## 2022-11-16 MED ORDER — POVIDONE-IODINE 5 % OP SOLN
OPHTHALMIC | Status: DC | PRN
  Administered 2022-11-16: 1 via OPHTHALMIC

## 2022-11-16 MED ORDER — SODIUM HYALURONATE 23MG/ML IO SOSY
PREFILLED_SYRINGE | INTRAOCULAR | Status: DC | PRN
  Administered 2022-11-16: .6 mL via INTRAOCULAR

## 2022-11-16 MED ORDER — MOXIFLOXACIN HCL 5 MG/ML IO SOLN
INTRAOCULAR | Status: AC
  Filled 2022-11-16: qty 1

## 2022-11-16 SURGICAL SUPPLY — 16 items
CATARACT SUITE SIGHTPATH (MISCELLANEOUS) ×1 IMPLANT
CLOTH BEACON ORANGE TIMEOUT ST (SAFETY) ×1 IMPLANT
EYE SHIELD UNIVERSAL CLEAR (GAUZE/BANDAGES/DRESSINGS) IMPLANT
FEE CATARACT SUITE SIGHTPATH (MISCELLANEOUS) ×1 IMPLANT
GLOVE BIOGEL PI IND STRL 7.0 (GLOVE) ×2 IMPLANT
GLOVE SS BIOGEL STRL SZ 6.5 (GLOVE) IMPLANT
LENS IOL RAYNER 18.5 (Intraocular Lens) ×1 IMPLANT
LENS IOL RAYONE EMV 18.5 (Intraocular Lens) IMPLANT
NDL HYPO 18GX1.5 BLUNT FILL (NEEDLE) ×1 IMPLANT
NEEDLE HYPO 18GX1.5 BLUNT FILL (NEEDLE) ×1 IMPLANT
PAD ARMBOARD 7.5X6 YLW CONV (MISCELLANEOUS) ×1 IMPLANT
SYR TB 1ML LL NO SAFETY (SYRINGE) ×1 IMPLANT
TAPE SURG TRANSPORE 1 IN (GAUZE/BANDAGES/DRESSINGS) IMPLANT
TAPE SURGICAL TRANSPORE 1 IN (GAUZE/BANDAGES/DRESSINGS) ×1
TIP IRRIGATON/ASPIRATION (MISCELLANEOUS) IMPLANT
WATER STERILE IRR 250ML POUR (IV SOLUTION) ×1 IMPLANT

## 2022-11-16 NOTE — Op Note (Signed)
Date of procedure: 11/16/22  Pre-operative diagnosis:  Visually significant combined form age-related cataract, Right Eye (H25.811)  Post-operative diagnosis:   1. Visually significant combined form age-related cataract, Right Eye (H25.811) 2. Pain and inflammation following cataract surgery Right Eye (H57.11)  Procedure:  Removal of cataract via phacoemulsification and insertion of intra-ocular lens Rayner [id number] +18.5D into the capsular bag of the Right Eye 2. Placement of Dextenza insert, Right Eye  Attending surgeon: Gerda Diss. Ranae Casebier, MD, MA  Anesthesia: MAC, Topical Akten  Complications: None  Estimated Blood Loss: <435m (minimal)  Specimens: None  Implants: As above  Indications:  Visually significant age-related cataract, Right Eye  Procedure:  The patient was seen and identified in the pre-operative area. The operative eye was identified and dilated.  The operative eye was marked.  Topical anesthesia was administered to the operative eye.     The patient was then to the operative suite and placed in the supine position.  A timeout was performed confirming the patient, procedure to be performed, and all other relevant information.   The patient's face was prepped and draped in the usual fashion for intra-ocular surgery.  A lid speculum was placed into the operative eye and the surgical microscope moved into place and focused.  A superotemporal paracentesis was created using a 20 gauge paracentesis blade.  Shugarcaine was injected into the anterior chamber.  Viscoelastic was injected into the anterior chamber.  A temporal clear-corneal main wound incision was created using a 2.445mmicrokeratome.  A continuous curvilinear capsulorrhexis was initiated using an irrigating cystitome and completed using capsulorrhexis forceps.  Hydrodissection and hydrodeliniation were performed.  Viscoelastic was injected into the anterior chamber.  A phacoemulsification handpiece and a chopper as a  second instrument were used to remove the nucleus and epinucleus. The irrigation/aspiration handpiece was used to remove any remaining cortical material.   The capsular bag was reinflated with viscoelastic, checked, and found to be intact.  The intraocular lens was inserted into the capsular bag.  The irrigation/aspiration handpiece was used to remove any remaining viscoelastic.  The clear corneal wound and paracentesis wounds were then hydrated and checked with Weck-Cels to be watertight. 0.35m74mf moxifloxacin was injected into the anterior chamber.  The lid-speculum was removed. The lower punctum was dilated. A Dextenza implant was placed in the lower canaliculus without complication.  The drape was removed.  The patient's face was cleaned with a wet and dry 4x4. A clear shield was taped over the eye. The patient was taken to the post-operative care unit in good condition, having tolerated the procedure well.  Post-Op Instructions: The patient [facility] day post-operative evaluation and will receive all other orders and instructions.

## 2022-11-16 NOTE — Anesthesia Postprocedure Evaluation (Signed)
Anesthesia Post Note  Patient: Marvin Bender  Procedure(s) Performed: CATARACT EXTRACTION PHACO AND INTRAOCULAR LENS PLACEMENT (IOC) with placement of Corticosteroid (Right: Eye)  Patient location during evaluation: Phase II Anesthesia Type: MAC Level of consciousness: awake and alert and oriented Pain management: pain level controlled Vital Signs Assessment: post-procedure vital signs reviewed and stable Respiratory status: spontaneous breathing, nonlabored ventilation and respiratory function stable Cardiovascular status: blood pressure returned to baseline and stable Postop Assessment: no apparent nausea or vomiting Anesthetic complications: no  No notable events documented.   Last Vitals:  Vitals:   11/16/22 0814 11/16/22 0919  BP: (!) 141/106 122/83  Pulse: 66 62  Resp:  14  Temp: 36.8 C 36.7 C  SpO2: 100% 100%    Last Pain:  Vitals:   11/16/22 0919  TempSrc: Oral  PainSc: 0-No pain                 Navid Lenzen C Shakoya Gilmore

## 2022-11-16 NOTE — Discharge Instructions (Addendum)
Please discharge patient when stable, will follow up today with Dr. Wrzosek at the [facility] [place] office immediately following discharge.  Leave shield in place until visit.  All paperwork with discharge instructions will be given at the office.  [facility] Address:  [address]  , [place] [address]  

## 2022-11-16 NOTE — Anesthesia Preprocedure Evaluation (Signed)
Anesthesia Evaluation  Patient identified by MRN, date of birth, ID band Patient awake    Reviewed: Allergy & Precautions, H&P , NPO status , Patient's Chart, lab work & pertinent test results  Airway Mallampati: III  TM Distance: >3 FB Neck ROM: Full    Dental  (+) Dental Advisory Given, Missing, Poor Dentition, Chipped   Pulmonary former smoker Pulmonary nodules    Pulmonary exam normal breath sounds clear to auscultation       Cardiovascular hypertension, Pt. on medications + DOE  Normal cardiovascular exam Rhythm:Regular Rate:Normal   1. Left ventricular ejection fraction, by estimation, is 60 to 65%. The  left ventricle has normal function. The left ventricle has no regional  wall motion abnormalities. There is mild left ventricular hypertrophy.  Left ventricular diastolic parameters  are indeterminate. Elevated left ventricular end-diastolic pressure.   2. Right ventricular systolic function is normal. The right ventricular  size is normal.   3. The mitral valve is grossly normal. No evidence of mitral valve  regurgitation.   4. The aortic valve is tricuspid. Aortic valve regurgitation is not  visualized. No aortic stenosis is present. Aortic valve mean gradient  measures 2.0 mmHg.   5. The inferior vena cava is normal in size with greater than 50%  respiratory variability, suggesting right atrial pressure of 3 mmHg.      Neuro/Psych  Headaches PSYCHIATRIC DISORDERS Anxiety     TIA (patient denied h/o TIA)   GI/Hepatic Neg liver ROS,GERD  Medicated,,  Endo/Other  negative endocrine ROS    Renal/GU negative Renal ROS  negative genitourinary   Musculoskeletal negative musculoskeletal ROS (+)    Abdominal   Peds negative pediatric ROS (+)  Hematology negative hematology ROS (+)   Anesthesia Other Findings   Reproductive/Obstetrics negative OB ROS                              Anesthesia Physical Anesthesia Plan  ASA: 3  Anesthesia Plan: MAC   Post-op Pain Management: Minimal or no pain anticipated   Induction: Intravenous  PONV Risk Score and Plan: 0  Airway Management Planned: Nasal Cannula and Natural Airway  Additional Equipment:   Intra-op Plan:   Post-operative Plan:   Informed Consent: I have reviewed the patients History and Physical, chart, labs and discussed the procedure including the risks, benefits and alternatives for the proposed anesthesia with the patient or authorized representative who has indicated his/her understanding and acceptance.     Dental advisory given  Plan Discussed with: CRNA and Surgeon  Anesthesia Plan Comments:         Anesthesia Quick Evaluation

## 2022-11-16 NOTE — Interval H&P Note (Signed)
History and Physical Interval Note:  11/16/2022 8:45 AM  Marvin Bender  has presented today for surgery, with the diagnosis of combined forms age related cataract; right ocular pain and inflammation; right.  The various methods of treatment have been discussed with the patient and family. After consideration of risks, benefits and other options for treatment, the patient has consented to  Procedure(s) with comments: CATARACT EXTRACTION PHACO AND INTRAOCULAR LENS PLACEMENT (IOC) with placement of Corticosteroid (Right) - right as a surgical intervention.  The patient's history has been reviewed, patient examined, no change in status, stable for surgery.  I have reviewed the patient's chart and labs.  Questions were answered to the patient's satisfaction.     Fabio Pierce

## 2022-11-16 NOTE — Transfer of Care (Signed)
Immediate Anesthesia Transfer of Care Note  Patient: Marvin Bender  Procedure(s) Performed: CATARACT EXTRACTION PHACO AND INTRAOCULAR LENS PLACEMENT (IOC) with placement of Corticosteroid (Right: Eye)  Patient Location: PACU  Anesthesia Type:MAC  Level of Consciousness: awake, alert , oriented, and patient cooperative  Airway & Oxygen Therapy: Patient Spontanous Breathing  Post-op Assessment: Report given to RN, Post -op Vital signs reviewed and stable, and Patient moving all extremities X 4  Post vital signs: Reviewed and stable  Last Vitals:  Vitals Value Taken Time  BP    Temp    Pulse    Resp    SpO2      Last Pain:  Vitals:   11/16/22 0814  TempSrc: Oral  PainSc: 0-No pain      Patients Stated Pain Goal: 7 (58/30/94 0768)  Complications: No notable events documented. VSS.

## 2023-02-18 ENCOUNTER — Encounter: Payer: Self-pay | Admitting: Gastroenterology

## 2023-04-04 ENCOUNTER — Other Ambulatory Visit: Payer: Self-pay | Admitting: Gastroenterology

## 2023-05-18 ENCOUNTER — Encounter: Payer: Self-pay | Admitting: Physician Assistant

## 2023-05-18 ENCOUNTER — Other Ambulatory Visit: Payer: Self-pay

## 2023-05-18 ENCOUNTER — Other Ambulatory Visit (INDEPENDENT_AMBULATORY_CARE_PROVIDER_SITE_OTHER): Payer: Medicare Other

## 2023-05-18 ENCOUNTER — Ambulatory Visit (INDEPENDENT_AMBULATORY_CARE_PROVIDER_SITE_OTHER): Payer: Medicare Other | Admitting: Physician Assistant

## 2023-05-18 DIAGNOSIS — M25571 Pain in right ankle and joints of right foot: Secondary | ICD-10-CM | POA: Diagnosis not present

## 2023-05-18 DIAGNOSIS — M25561 Pain in right knee: Secondary | ICD-10-CM

## 2023-05-18 NOTE — Progress Notes (Signed)
Office Visit Note   Patient: Marvin Bender           Date of Birth: [birth year]           MRN: [id number] Visit Date: 05/18/2023              Requested by: No referring provider defined for this encounter. PCP: System, Provider Not In   Assessment & Plan: Visit Diagnoses:  1. Right knee pain, unspecified chronicity   2. Pain in right ankle and joints of right foot     Plan: Pleasant 67 year old gentleman with a 50-month history of right lateral knee pain.  This occurred after a fall.  Denies any previous history of knee pain.  Denies any significant swelling or ecchymosis at the time of the injury.  He has been treated with a steroid injection by another provider first time it helped a little but the most recent time it did not help at all.  He does have times of giving way and is currently using a cane because of this knee issue.  Does not previously will use a cane.  X-rays do not show significant degenerative changes.  He has had a knee arthroscopy on the other knee.  Findings consistent or concerning for a lateral meniscus tear.  Will order an MRI he does have a history of severe claustrophobia so will be an open MRI.  They have requested to follow-up with Dr. Roda Shutters once completed  Follow-Up Instructions: No follow-ups on file.   Orders:  Orders Placed This Encounter  Procedures   XR KNEE 3 VIEW RIGHT   XR Ankle Complete Right   No orders of the defined types were placed in this encounter.     Procedures: No procedures performed   Clinical Data: No additional findings.   Subjective: Chief Complaint  Patient presents with   Right Ankle - Pain   Right Knee - Pain    HPI patient is a 67 year old gentleman with a chief complaint of right knee pain.  Describes the pain as sharp and his knee gives way with walking.  No pain with sitting.  He was seen by a physician in [place] a week ago and was given a steroid injection into the knee he got no relief.  Also some stinging  on the lateral side of his knee with pressure.  Comes in today complaining that he had some swelling in his ankle but soaked in Epsom salt and now it is fine.  He thinks this is all secondary to a fall he had 3 months ago.  No fever chills.  States his pain is moderate  Review of Systems  All other systems reviewed and are negative.    Objective: Vital Signs: There were no vitals taken for this visit.  Physical Exam Constitutional:      Appearance: Normal appearance.  Pulmonary:     Effort: Pulmonary effort is normal.  Skin:    General: Skin is warm and dry.  Neurological:     General: No focal deficit present.     Mental Status: He is alert.     Ortho Exam Examination of his right knee no effusion no erythema compartments are soft nontender he is neurovascular intact distally without any radicular findings.  He has good stability both laterally and medially with valgus and varus testing good endpoint on anterior draw.  He does have noted no tenderness over the medial joint line does have tenderness over the posterior medial joint line  no particular pain with terminal extension no pain over patellofemoral joint Specialty Comments:  No specialty comments available.  Imaging: No results found.   PMFS History: Patient Active Problem List   Diagnosis Date Noted   Pain in right knee 05/18/2023   Hypokalemia    TIA (transient ischemic attack) 09/04/2021   Obesity (BMI 30-39.9) 05/20/2017   Multiple pulmonary nodules 09/30/2013   DOE (dyspnea on exertion) 01/09/2010   HYPERLIPIDEMIA 01/07/2010   ANXIETY 01/07/2010   Essential hypertension 01/07/2010   HEADACHE, CHRONIC 01/07/2010   Past Medical History:  Diagnosis Date   Anxiety    GERD (gastroesophageal reflux disease)    Hypercholesteremia    Hypertension    Hypokalemia    Pulmonary nodules    TIA (transient ischemic attack)    PT denies    Family History  Problem Relation Age of Onset   Asthma Mother    Heart  disease Mother    Dementia Mother    Heart disease Sister    Arthritis Sister    Heart failure Brother    Diabetes Maternal Grandmother    Dementia Maternal Aunt    Dementia Maternal Uncle    Colon cancer Neg Hx    Stomach cancer Neg Hx    Esophageal cancer Neg Hx    Colon polyps Neg Hx     Past Surgical History:  Procedure Laterality Date   KNEE ARTHROSCOPY WITH MEDIAL MENISECTOMY Left 04/30/2015   Procedure: LEFT KNEE ARTHROSCOPY WITH PARTIAL MEDIAL MENISECTOMY;  Surgeon: Darreld Mclean, MD;  Location: [facility];  Service: Orthopedics;  Laterality: Left;   NO PAST SURGERIES     Social History   Occupational History   Occupation: Retired  Tobacco Use   Smoking status: Former    Packs/day: 1.00    Years: 40.00    Additional pack years: 0.00    Total pack years: 40.00    Types: Cigarettes    Quit date: 04/12/2007    Years since quitting: 16.1   Smokeless tobacco: Never  Vaping Use   Vaping Use: Never used  Substance and Sexual Activity   Alcohol use: Not Currently    Comment: occasional beer    Drug use: No   Sexual activity: Never    Birth control/protection: None

## 2023-06-04 ENCOUNTER — Telehealth: Payer: Self-pay

## 2023-06-04 NOTE — Telephone Encounter (Signed)
Please see care everywhere for results for MRI, right knee.  Per Marisue Ivan, RN with [facility] Code Significant Report.  Please advise.  Thank you.

## 2023-06-08 ENCOUNTER — Ambulatory Visit (INDEPENDENT_AMBULATORY_CARE_PROVIDER_SITE_OTHER): Payer: Medicare Other | Admitting: Orthopaedic Surgery

## 2023-06-08 DIAGNOSIS — M84451A Pathological fracture, right femur, initial encounter for fracture: Secondary | ICD-10-CM | POA: Diagnosis not present

## 2023-06-08 DIAGNOSIS — S83241A Other tear of medial meniscus, current injury, right knee, initial encounter: Secondary | ICD-10-CM

## 2023-06-08 MED ORDER — TRAMADOL HCL 50 MG PO TABS: 50.0000 mg | ORAL_TABLET | Freq: Every day | ORAL | 0 refills | Status: AC | PRN

## 2023-06-08 NOTE — Progress Notes (Signed)
d  Office Visit Note   Patient: Marvin Bender           Date of Birth: 04/03/1956           MRN: [id number] Visit Date: 06/08/2023              Requested by: No referring provider defined for this encounter. PCP: System, Provider Not In   Assessment & Plan: Visit Diagnoses:  1. Insufficiency fracture of femur, right, initial encounter (HCC)   2. Acute medial meniscus tear of right knee, initial encounter     Plan: Impression is 67 year old gentleman with insufficiency fracture of the lateral femoral condyle as well as a medial meniscus tear.  The MRI was done at [facility] and I was able to review the report but I have asked the patient to obtain the actual MRI for me.  Based on the report my initial impression and recommendation is for arthroscopic partial medial meniscectomy and some chondroplasty of the lateral femoral condyle.  We will reach out to the patient to finalize treatment plans once I have reviewed the MRI.  The patient later brought the CD and I was able to independently review and interpret the images which shows a complex tear of the medial meniscus as well as a subchondral insufficiency fracture of the lateral femoral condyle with extensive bony edema.  Based on these findings my recommendation is unchanged.  We will reach out to the patient to confirm surgery time.  Follow-Up Instructions: No follow-ups on file.   Orders:  No orders of the defined types were placed in this encounter.  Meds ordered this encounter  Medications   traMADol (ULTRAM) 50 MG tablet    Sig: Take 1-2 tablets (50-100 mg total) by mouth daily as needed.    Dispense:  20 tablet    Refill:  0      Procedures: No procedures performed   Clinical Data: No additional findings.   Subjective: Chief Complaint  Patient presents with   Right Knee - Pain    HPI Marvin Bender is a 67 year old gentleman with chronic right knee pain referral from [facility] for surgical consultation.  He just had an  MRI a few days ago and he and his wife are here to go over the study and to discuss treatment options. Review of Systems  Constitutional: Negative.   HENT: Negative.    Eyes: Negative.   Respiratory: Negative.    Cardiovascular: Negative.   Gastrointestinal: Negative.   Endocrine: Negative.   Genitourinary: Negative.   Skin: Negative.   Allergic/Immunologic: Negative.   Neurological: Negative.   Hematological: Negative.   Psychiatric/Behavioral: Negative.    All other systems reviewed and are negative.    Objective: Vital Signs: There were no vitals taken for this visit.  Physical Exam Vitals and nursing note reviewed.  Constitutional:      Appearance: He is well-developed.  HENT:     Head: Normocephalic and atraumatic.  Eyes:     Pupils: Pupils are equal, round, and reactive to light.  Pulmonary:     Effort: Pulmonary effort is normal.  Abdominal:     Palpations: Abdomen is soft.  Musculoskeletal:        General: Normal range of motion.     Cervical back: Neck supple.  Skin:    General: Skin is warm.  Neurological:     Mental Status: He is alert and oriented to person, place, and time.  Psychiatric:  Behavior: Behavior normal.        Thought Content: Thought content normal.        Judgment: Judgment normal.     Ortho Exam Examination right knee shows trace effusion.  He has medial joint line tenderness and tenderness along the lateral femoral condyle.  No retinacular tenderness.  Mild pain with range of motion of the knee.  Collaterals and cruciates are stable.  Antalgic gait with a crutch. Specialty Comments:  No specialty comments available.  Imaging: No results found.   PMFS History: Patient Active Problem List   Diagnosis Date Noted   Pain in right knee 05/18/2023   Hypokalemia    TIA (transient ischemic attack) 09/04/2021   Obesity (BMI 30-39.9) 05/20/2017   Multiple pulmonary nodules 09/30/2013   DOE (dyspnea on exertion) 01/09/2010    HYPERLIPIDEMIA 01/07/2010   ANXIETY 01/07/2010   Essential hypertension 01/07/2010   HEADACHE, CHRONIC 01/07/2010   Past Medical History:  Diagnosis Date   Anxiety    GERD (gastroesophageal reflux disease)    Hypercholesteremia    Hypertension    Hypokalemia    Pulmonary nodules    TIA (transient ischemic attack)    PT denies    Family History  Problem Relation Age of Onset   Asthma Mother    Heart disease Mother    Dementia Mother    Heart disease Sister    Arthritis Sister    Heart failure Brother    Diabetes Maternal Grandmother    Dementia Maternal Aunt    Dementia Maternal Uncle    Colon cancer Neg Hx    Stomach cancer Neg Hx    Esophageal cancer Neg Hx    Colon polyps Neg Hx     Past Surgical History:  Procedure Laterality Date   KNEE ARTHROSCOPY WITH MEDIAL MENISECTOMY Left 04/30/2015   Procedure: LEFT KNEE ARTHROSCOPY WITH PARTIAL MEDIAL MENISECTOMY;  Surgeon: Darreld Mclean, MD;  Location: [facility];  Service: Orthopedics;  Laterality: Left;   NO PAST SURGERIES     Social History   Occupational History   Occupation: Retired  Tobacco Use   Smoking status: Former    Packs/day: 1.00    Years: 40.00    Additional pack years: 0.00    Total pack years: 40.00    Types: Cigarettes    Quit date: 04/12/2007    Years since quitting: 16.1   Smokeless tobacco: Never  Vaping Use   Vaping Use: Never used  Substance and Sexual Activity   Alcohol use: Not Currently    Comment: occasional beer    Drug use: No   Sexual activity: Never    Birth control/protection: None

## 2023-06-11 ENCOUNTER — Telehealth: Payer: Self-pay | Admitting: Orthopaedic Surgery

## 2023-06-11 NOTE — Telephone Encounter (Signed)
Patient's wife Lynden Ang calling to find out if you want patient to stop aspirin 81mg  prior to knee surgery. Patient's surgery date for right knee scope is 06-17-23 at [facility].  Also asking is something else can be called in because Tramadol is not working.  [facility] [place]  Patient's cell  [phone] .

## 2023-06-11 NOTE — Telephone Encounter (Signed)
He does not need to stop aspirin

## 2023-06-14 ENCOUNTER — Telehealth: Payer: Self-pay | Admitting: Orthopaedic Surgery

## 2023-06-14 ENCOUNTER — Other Ambulatory Visit: Payer: Self-pay | Admitting: Physician Assistant

## 2023-06-14 MED ORDER — HYDROCODONE-ACETAMINOPHEN 5-325 MG PO TABS: 1.0000 | ORAL_TABLET | Freq: Three times a day (TID) | ORAL | 0 refills | Status: DC | PRN

## 2023-06-14 MED ORDER — ONDANSETRON HCL 4 MG PO TABS: 4.0000 mg | ORAL_TABLET | Freq: Three times a day (TID) | ORAL | 0 refills | Status: AC | PRN

## 2023-06-14 NOTE — Telephone Encounter (Signed)
Called patient to let him know he does not need to stop aspirin for his 06-17-23 knee arthroscopy with Dr. Roda Shutters at [facility].  Voice mail is full and unable to leave message

## 2023-06-16 DIAGNOSIS — M84451A Pathological fracture, right femur, initial encounter for fracture: Secondary | ICD-10-CM | POA: Diagnosis not present

## 2023-06-16 DIAGNOSIS — M94261 Chondromalacia, right knee: Secondary | ICD-10-CM | POA: Diagnosis not present

## 2023-06-16 DIAGNOSIS — S83231A Complex tear of medial meniscus, current injury, right knee, initial encounter: Secondary | ICD-10-CM | POA: Diagnosis not present

## 2023-06-17 ENCOUNTER — Telehealth: Payer: Self-pay | Admitting: Orthopaedic Surgery

## 2023-06-17 ENCOUNTER — Encounter: Payer: Self-pay | Admitting: Orthopaedic Surgery

## 2023-06-17 NOTE — Telephone Encounter (Signed)
Patient's wife called. Says the pain medication isn't helping him. Her [phone]

## 2023-06-17 NOTE — Telephone Encounter (Signed)
Patient's wife Lynden Ang called asked when should patient start (PT)  The number to contact Lynden Ang is [phone]

## 2023-06-18 ENCOUNTER — Telehealth: Payer: Self-pay | Admitting: Orthopaedic Surgery

## 2023-06-18 ENCOUNTER — Other Ambulatory Visit: Payer: Self-pay | Admitting: Physician Assistant

## 2023-06-18 ENCOUNTER — Encounter: Payer: Self-pay | Admitting: Orthopaedic Surgery

## 2023-06-18 MED ORDER — METHOCARBAMOL 750 MG PO TABS: 750.0000 mg | ORAL_TABLET | Freq: Two times a day (BID) | ORAL | 2 refills | Status: AC | PRN

## 2023-06-18 MED ORDER — HYDROCODONE-ACETAMINOPHEN 5-325 MG PO TABS: 1.0000 | ORAL_TABLET | Freq: Four times a day (QID) | ORAL | 0 refills | Status: DC | PRN

## 2023-06-18 NOTE — Telephone Encounter (Signed)
sent 

## 2023-06-18 NOTE — Telephone Encounter (Signed)
Will not start formal outpatient PT until after we see him. Just did a knee scope yesterday afternnon

## 2023-06-18 NOTE — Telephone Encounter (Signed)
Pt wife called on call dr and they increased his pain med intake and he will now run out before the weekend if not refilled. [facility] [place]

## 2023-06-18 NOTE — Telephone Encounter (Signed)
Notified patient's wife

## 2023-06-18 NOTE — Telephone Encounter (Signed)
Went to vm.  I would try 1 or even 1-2 every 6 hours.  If this is truly not helping enough, he can do 1 every 4, but he really should not be in that much pain.  Continue taking nsaids as needed.  I have also sent in muscle relaxer to help

## 2023-06-18 NOTE — Telephone Encounter (Signed)
Sent refill and increased frequency

## 2023-06-18 NOTE — Telephone Encounter (Signed)
Ok.  He should not need to take that much for what we did. I would take 1 every 6 and add ibuprofen if needed.  I will also send in a muscle relaxer

## 2023-06-22 ENCOUNTER — Telehealth: Payer: Self-pay | Admitting: Physician Assistant

## 2023-06-22 ENCOUNTER — Other Ambulatory Visit: Payer: Self-pay | Admitting: Physician Assistant

## 2023-06-22 MED ORDER — HYDROCODONE-ACETAMINOPHEN 5-325 MG PO TABS: 1.0000 | ORAL_TABLET | Freq: Four times a day (QID) | ORAL | 0 refills | Status: DC | PRN

## 2023-06-22 NOTE — Telephone Encounter (Signed)
Sent in a refill.  I had told him to increase frequency after sending in last rx, but please have him back down to the way this rx has been written

## 2023-06-22 NOTE — Telephone Encounter (Signed)
Patient's wife Lynden Ang called advised patient need Rx refilled Hydrocodone. The number to contact Lynden Ang is [phone]

## 2023-06-22 NOTE — Telephone Encounter (Signed)
Notified patient.

## 2023-06-23 ENCOUNTER — Telehealth: Payer: Self-pay | Admitting: Orthopaedic Surgery

## 2023-06-23 ENCOUNTER — Telehealth: Payer: Self-pay | Admitting: Physician Assistant

## 2023-06-23 NOTE — Telephone Encounter (Signed)
[facility] [place] and they are telling her they did not get a Rx order from [place] yesterday please advise, may need to be resent

## 2023-06-23 NOTE — Telephone Encounter (Signed)
Patient's wife called. [facility] [place] needs clarification on the pain medication before patient can get it.  Dr. Roda Shutters or Mardella Layman has to call [facility] to give approval for RX to be filled.

## 2023-06-23 NOTE — Telephone Encounter (Signed)
Spoke with patient's wife. Medication should be ready for pick up. Spoke with pharmacy and they will fill early.

## 2023-06-23 NOTE — Telephone Encounter (Signed)
Tried to call patient twice. He cannot hear me, but I can hear him. Will try again later.

## 2023-06-23 NOTE — Telephone Encounter (Signed)
Spoke with patient's wife. Pharmacy has filled medication.

## 2023-06-25 ENCOUNTER — Ambulatory Visit (INDEPENDENT_AMBULATORY_CARE_PROVIDER_SITE_OTHER): Payer: Medicare Other | Admitting: Physician Assistant

## 2023-06-25 ENCOUNTER — Encounter: Payer: Self-pay | Admitting: Physician Assistant

## 2023-06-25 DIAGNOSIS — Z9889 Other specified postprocedural states: Secondary | ICD-10-CM

## 2023-06-25 MED ORDER — HYDROCODONE-ACETAMINOPHEN 5-325 MG PO TABS: 1.0000 | ORAL_TABLET | Freq: Three times a day (TID) | ORAL | 0 refills | Status: DC | PRN

## 2023-06-25 NOTE — Progress Notes (Signed)
Post-Op Visit Note   Patient: Marvin Bender           Date of Birth: 1956-09-16           MRN: [id number] Visit Date: 06/25/2023 PCP: System, Provider Not In   Assessment & Plan:  Chief Complaint:  Chief Complaint  Patient presents with   Right Knee - Follow-up    Right knee scope 06/17/2023   Visit Diagnoses:  1. S/P right knee arthroscopy     Plan: Patient is a pleasant 67 year old gentleman who comes in today 1 week status post right knee arthroscopic debridement medial meniscus and subchondroplasty lateral femoral condyle, date of surgery 06/17/2023.  He initially had significant pain but notes that this has improved.  He is still taking Norco.  He is ambulating primarily with a walker.  Examination of his right knee reveals a small effusion.  Well-healed surgical portals with nylon sutures in place.  No evidence of infection or cellulitis.  Calves are soft nontender.  Today, sutures were removed and Steri-Strips applied.  Intraoperative pictures reviewed.  Home exercise program provided.  I have discussed with the patient should he need outpatient physical therapy to let us know.  Otherwise, follow-up in 5 weeks for recheck.  Call with concerns or questions meantime.  Follow-Up Instructions: Return in about 5 weeks (around 07/30/2023).   Orders:  No orders of the defined types were placed in this encounter.  No orders of the defined types were placed in this encounter.   Imaging: No new imaging  PMFS History: Patient Active Problem List   Diagnosis Date Noted   Pain in right knee 05/18/2023   Hypokalemia    TIA (transient ischemic attack) 09/04/2021   Obesity (BMI 30-39.9) 05/20/2017   Multiple pulmonary nodules 09/30/2013   DOE (dyspnea on exertion) 01/09/2010   HYPERLIPIDEMIA 01/07/2010   ANXIETY 01/07/2010   Essential hypertension 01/07/2010   HEADACHE, CHRONIC 01/07/2010   Past Medical History:  Diagnosis Date   Anxiety    GERD (gastroesophageal reflux  disease)    Hypercholesteremia    Hypertension    Hypokalemia    Pulmonary nodules    TIA (transient ischemic attack)    PT denies    Family History  Problem Relation Age of Onset   Asthma Mother    Heart disease Mother    Dementia Mother    Heart disease Sister    Arthritis Sister    Heart failure Brother    Diabetes Maternal Grandmother    Dementia Maternal Aunt    Dementia Maternal Uncle    Colon cancer Neg Hx    Stomach cancer Neg Hx    Esophageal cancer Neg Hx    Colon polyps Neg Hx     Past Surgical History:  Procedure Laterality Date   KNEE ARTHROSCOPY WITH MEDIAL MENISECTOMY Left 04/30/2015   Procedure: LEFT KNEE ARTHROSCOPY WITH PARTIAL MEDIAL MENISECTOMY;  Surgeon: Darreld Mclean, MD;  Location: [facility];  Service: Orthopedics;  Laterality: Left;   NO PAST SURGERIES     Social History   Occupational History   Occupation: Retired  Tobacco Use   Smoking status: Former    Packs/day: 1.00    Years: 40.00    Additional pack years: 0.00    Total pack years: 40.00    Types: Cigarettes    Quit date: 04/12/2007    Years since quitting: 16.2   Smokeless tobacco: Never  Vaping Use   Vaping Use: Never used  Substance and  Sexual Activity   Alcohol use: Not Currently    Comment: occasional beer    Drug use: No   Sexual activity: Never    Birth control/protection: None

## 2023-07-11 ENCOUNTER — Other Ambulatory Visit: Payer: Self-pay | Admitting: Gastroenterology

## 2023-07-12 ENCOUNTER — Other Ambulatory Visit: Payer: Self-pay | Admitting: Physician Assistant

## 2023-07-12 ENCOUNTER — Telehealth: Payer: Self-pay | Admitting: Orthopaedic Surgery

## 2023-07-12 MED ORDER — HYDROCODONE-ACETAMINOPHEN 5-325 MG PO TABS: 1.0000 | ORAL_TABLET | Freq: Every day | ORAL | 0 refills | Status: DC | PRN

## 2023-07-12 NOTE — Telephone Encounter (Signed)
Sent in a small rx with different instructions.  If he still needs pain meds once finished with this, we will need to wean to norco or tylenol 3

## 2023-07-12 NOTE — Telephone Encounter (Signed)
Relayed messaged to patient.

## 2023-07-12 NOTE — Telephone Encounter (Signed)
Hydrocodone ACET  [facility] in [place]

## 2023-07-30 ENCOUNTER — Encounter: Payer: Self-pay | Admitting: Physician Assistant

## 2023-07-30 ENCOUNTER — Ambulatory Visit: Payer: Medicare Other | Admitting: Physician Assistant

## 2023-07-30 DIAGNOSIS — Z9889 Other specified postprocedural states: Secondary | ICD-10-CM

## 2023-07-30 MED ORDER — TRAMADOL HCL 50 MG PO TABS: 50.0000 mg | ORAL_TABLET | Freq: Two times a day (BID) | ORAL | 2 refills | Status: AC | PRN

## 2023-07-30 NOTE — Progress Notes (Signed)
Post-Op Visit Note   Patient: Marvin Bender           Date of Birth: Feb 23, 1956           MRN: [id number] Visit Date: 07/30/2023 PCP: System, Provider Not In   Assessment & Plan:  Chief Complaint:  Chief Complaint  Patient presents with   Right Knee - Follow-up    Right knee scope 06/17/2023   Visit Diagnoses:  1. S/P right knee arthroscopy     Plan: Patient is a pleasant 67 year old gentleman who comes in today approximately 6 weeks status post right knee arthroscopic debridement medial meniscus and subchondroplasty lateral femoral condyle date of surgery 06/17/2023.  He has been doing better but still in some pain.  He has been taking over the counter NSAIDs for this.  He has been working on home exercise program and is still ambulating with a cane.  Examination of the right knee reveals a small effusion.  Range of motion 10 to 95 degrees.  Calf is soft nontender.  He is neurovascularly intact distally.  At this point, he will continue to advance with activity as tolerated.  I have discussed that he may notice increased pain and swelling if he overdoes things.  He will ice and elevate.  I have sent in tramadol.  He will follow-up with [place] as needed.  Call with concerns or questions.  Follow-Up Instructions: Return if symptoms worsen or fail to improve.   Orders:  No orders of the defined types were placed in this encounter.  No orders of the defined types were placed in this encounter.   Imaging: No results found.  PMFS History: Patient Active Problem List   Diagnosis Date Noted   Pain in right knee 05/18/2023   Hypokalemia    TIA (transient ischemic attack) 09/04/2021   Obesity (BMI 30-39.9) 05/20/2017   Multiple pulmonary nodules 09/30/2013   DOE (dyspnea on exertion) 01/09/2010   HYPERLIPIDEMIA 01/07/2010   ANXIETY 01/07/2010   Essential hypertension 01/07/2010   HEADACHE, CHRONIC 01/07/2010   Past Medical History:  Diagnosis Date   Anxiety    GERD  (gastroesophageal reflux disease)    Hypercholesteremia    Hypertension    Hypokalemia    Pulmonary nodules    TIA (transient ischemic attack)    PT denies    Family History  Problem Relation Age of Onset   Asthma Mother    Heart disease Mother    Dementia Mother    Heart disease Sister    Arthritis Sister    Heart failure Brother    Diabetes Maternal Grandmother    Dementia Maternal Aunt    Dementia Maternal Uncle    Colon cancer Neg Hx    Stomach cancer Neg Hx    Esophageal cancer Neg Hx    Colon polyps Neg Hx     Past Surgical History:  Procedure Laterality Date   KNEE ARTHROSCOPY WITH MEDIAL MENISECTOMY Left 04/30/2015   Procedure: LEFT KNEE ARTHROSCOPY WITH PARTIAL MEDIAL MENISECTOMY;  Surgeon: Darreld Mclean, MD;  Location: [facility];  Service: Orthopedics;  Laterality: Left;   NO PAST SURGERIES     Social History   Occupational History   Occupation: Retired  Tobacco Use   Smoking status: Former    Current packs/day: 0.00    Average packs/day: 1 pack/day for 40.0 years (40.0 ttl pk-yrs)    Types: Cigarettes    Start date: 04/12/1967    Quit date: 04/12/2007    Years since  quitting: 16.3   Smokeless tobacco: Never  Vaping Use   Vaping status: Never Used  Substance and Sexual Activity   Alcohol use: Not Currently    Comment: occasional beer    Drug use: No   Sexual activity: Never    Birth control/protection: None

## 2023-08-08 ENCOUNTER — Encounter: Payer: Self-pay | Admitting: Emergency Medicine

## 2023-08-08 ENCOUNTER — Ambulatory Visit
Admission: EM | Admit: 2023-08-08 | Discharge: 2023-08-08 | Disposition: A | Discharge status: Home / Self Care | Payer: Medicare Other | Attending: Nurse Practitioner | Admitting: Nurse Practitioner

## 2023-08-08 DIAGNOSIS — R21 Rash and other nonspecific skin eruption: Secondary | ICD-10-CM | POA: Diagnosis not present

## 2023-08-08 MED ORDER — TRIAMCINOLONE ACETONIDE 0.1 % EX CREA: 1.0000 | TOPICAL_CREAM | Freq: Two times a day (BID) | CUTANEOUS | 0 refills | Status: AC

## 2023-08-08 NOTE — ED Provider Notes (Signed)
[facility]    CSN: [id number] Arrival date & time: 08/08/23  1150      History   Chief Complaint No chief complaint on file.   HPI Marvin Bender is a 68 y.o. male.   The history is provided by the patient.   Patient presents for questionable rash on his left side.  Patient states 1 day ago, he removed 2 ticks from his back.  Shortly thereafter, he noticed some redness to his left side.  He states the area is itchy.  He was concerned that the area may be due to the tick bite.  He denies fever, chills, oozing, fluctuance, drainage, exposure to new foods, medications, lotions, detergents, or plants.  He has been using hydrocortisone to the area.  Past Medical History:  Diagnosis Date   Anxiety    GERD (gastroesophageal reflux disease)    Hypercholesteremia    Hypertension    Hypokalemia    Pulmonary nodules    TIA (transient ischemic attack)    PT denies    Patient Active Problem List   Diagnosis Date Noted   Pain in right knee 05/18/2023   Hypokalemia    TIA (transient ischemic attack) 09/04/2021   Obesity (BMI 30-39.9) 05/20/2017   Multiple pulmonary nodules 09/30/2013   DOE (dyspnea on exertion) 01/09/2010   HYPERLIPIDEMIA 01/07/2010   ANXIETY 01/07/2010   Essential hypertension 01/07/2010   HEADACHE, CHRONIC 01/07/2010    Past Surgical History:  Procedure Laterality Date   KNEE ARTHROSCOPY WITH MEDIAL MENISECTOMY Left 04/30/2015   Procedure: LEFT KNEE ARTHROSCOPY WITH PARTIAL MEDIAL MENISECTOMY;  Surgeon: Darreld Mclean, MD;  Location: [facility];  Service: Orthopedics;  Laterality: Left;   NO PAST SURGERIES         Home Medications    Prior to Admission medications   Medication Sig Start Date End Date Taking? Authorizing Provider  triamcinolone cream (KENALOG) 0.1 % Apply 1 Application topically 2 (two) times daily. 08/08/23  Yes Kandi Brusseau-Warren, Sadie Haber, NP  albuterol (PROVENTIL HFA;VENTOLIN HFA) 108 (90 Base) MCG/ACT inhaler Inhale 2 puffs into  the lungs every 6 (six) hours as needed for wheezing or shortness of breath. 02/22/17   Dettinger, Elige Radon, MD  cetirizine (ZYRTEC) 10 MG tablet TAKE 1 TABLET BY MOUTH EVERY DAY 06/02/19   Delynn Flavin M, DO  citalopram (CELEXA) 10 MG tablet Take 10 mg by mouth daily. 03/05/22   [provider]  DENTA 5000 PLUS 1.1 % CREA dental cream Take 1 application. by mouth. Twice weekly 02/07/22   [provider]  fluticasone (FLONASE) 50 MCG/ACT nasal spray 2 sprays daily.    [provider]  gabapentin (NEURONTIN) 100 MG capsule Take 1 capsule by mouth daily. 03/03/22   [provider]  hydrochlorothiazide (MICROZIDE) 12.5 MG capsule Take 12.5 mg by mouth daily. 02/19/22   [provider]  HYDROcodone-acetaminophen (NORCO) 5-325 MG tablet Take 1 tablet by mouth daily as needed. 07/12/23   Cristie Hem, PA-C  KLOR-CON M20 20 MEQ tablet Take 2 tablets (40 mEq total) by mouth daily. 09/05/21   Vassie Loll, MD  LORazepam (ATIVAN) 0.5 MG tablet Take 0.5 mg by mouth every 8 (eight) hours as needed for anxiety.    [provider]  methocarbamol (ROBAXIN-750) 750 MG tablet Take 1 tablet (750 mg total) by mouth 2 (two) times daily as needed for muscle spasms. 06/18/23   Cristie Hem, PA-C  metoprolol succinate (TOPROL-XL) 100 MG 24 hr tablet Take 50-100 mg  by mouth daily. 12/08/21   [provider]  ondansetron (ZOFRAN) 4 MG tablet Take 1 tablet (4 mg total) by mouth every 8 (eight) hours as needed for nausea or vomiting. 06/14/23   Cristie Hem, PA-C  pantoprazole (PROTONIX) 40 MG tablet TAKE 1 TABLET BY MOUTH EVERY DAY 07/12/23   Meryl Dare, MD  rosuvastatin (CRESTOR) 10 MG tablet Take 1 tablet (10 mg total) by mouth at bedtime. 09/05/21   Vassie Loll, MD  traMADol (ULTRAM) 50 MG tablet Take 1-2 tablets (50-100 mg total) by mouth daily as needed. 06/08/23   Tarry Kos, MD  traMADol (ULTRAM) 50 MG tablet Take 1 tablet (50 mg total) by  mouth every 12 (twelve) hours as needed. 07/30/23   Cristie Hem, PA-C  vitamin B-12 (CYANOCOBALAMIN) 1000 MCG tablet Take 2,000 mcg by mouth at bedtime.    [provider]    Family History Family History  Problem Relation Age of Onset   Asthma Mother    Heart disease Mother    Dementia Mother    Heart disease Sister    Arthritis Sister    Heart failure Brother    Diabetes Maternal Grandmother    Dementia Maternal Aunt    Dementia Maternal Uncle    Colon cancer Neg Hx    Stomach cancer Neg Hx    Esophageal cancer Neg Hx    Colon polyps Neg Hx     Social History Social History   Tobacco Use   Smoking status: Former    Current packs/day: 0.00    Average packs/day: 1 pack/day for 40.0 years (40.0 ttl pk-yrs)    Types: Cigarettes    Start date: 04/12/1967    Quit date: 04/12/2007    Years since quitting: 16.3   Smokeless tobacco: Never  Vaping Use   Vaping status: Never Used  Substance Use Topics   Alcohol use: Not Currently    Comment: occasional beer    Drug use: No     Allergies   Patient has no known allergies.   Review of Systems Review of Systems Per HPI  Physical Exam Triage Vital Signs ED Triage Vitals  Encounter Vitals Group     BP 08/08/23 1307 117/75     Systolic BP Percentile --      Diastolic BP Percentile --      Pulse Rate 08/08/23 1307 84     Resp 08/08/23 1307 18     Temp 08/08/23 1307 98 F (36.7 C)     Temp Source 08/08/23 1307 Oral     SpO2 08/08/23 1307 98 %     Weight --      Height --      Head Circumference --      Peak Flow --      Pain Score 08/08/23 1309 0     Pain Loc --      Pain Education --      Exclude from Growth Chart --    No data found.  Updated Vital Signs BP 117/75 (BP Location: Right Arm)   Pulse 84   Temp 98 F (36.7 C) (Oral)   Resp 18   SpO2 98%   Visual Acuity Right Eye Distance:   Left Eye Distance:   Bilateral Distance:    Right Eye Near:   Left Eye Near:    Bilateral Near:      Physical Exam Vitals and nursing note reviewed.  Constitutional:      General:  He is not in acute distress.    Appearance: Normal appearance.  HENT:     Head: Normocephalic.  Eyes:     Extraocular Movements: Extraocular movements intact.     Pupils: Pupils are equal, round, and reactive to light.  Pulmonary:     Effort: Pulmonary effort is normal.  Musculoskeletal:     Cervical back: Normal range of motion.  Skin:    General: Skin is warm and dry.     Findings: Rash present.     Comments: Area of erythema noted to the patient's left side/chest wall.  Area is flat, with non-distinguished borders, and with linear markings going down the left side.  There is no oozing, tenderness, fluctuance, or drainage present.  Neurological:     General: No focal deficit present.     Mental Status: He is alert and oriented to person, place, and time.  Psychiatric:        Mood and Affect: Mood normal.        Behavior: Behavior normal.      UC Treatments / Results  Labs (all labs ordered are listed, but only abnormal results are displayed) Labs Reviewed - No data to display  EKG   Radiology No results found.  Procedures Procedures (including critical care time)  Medications Ordered in UC Medications - No data to display  Initial Impression / Assessment and Plan / UC Course  I have reviewed the triage vital signs and the nursing notes.  Pertinent labs & imaging results that were available during my care of the patient were reviewed by me and considered in my medical decision making (see chart for details).  The patient is well-appearing, he is in no acute distress, vital signs are stable.  Patient with rash to his left side.  Difficult to determine the cause of the rash.  Will treat with triamcinolone cream 0.1%.  Supportive care recommendations were provided and discussed with the patient to include over-the-counter antihistamines, lukewarm baths or showers, and cool cloths.   Patient advised to follow-up in this clinic or with his primary care physician if symptoms fail to improve.  Patient is in agreement with this plan of care and verbalizes understanding.  All questions were answered.  Patient stable for discharge.  Final Clinical Impressions(s) / UC Diagnoses   Final diagnoses:  Rash and nonspecific skin eruption     Discharge Instructions      Apply medication as prescribed. May take over-the-counter Zyrtec during the daytime and Benadryl at bedtime to help with itching. Avoid hot baths or showers while symptoms persist.  Recommend taking lukewarm baths. May apply cool cloths to the area to help with itching or discomfort. Avoid scratching, rubbing, or manipulating the areas while symptoms persist. If symptoms do not improve with this treatment, please follow-up with your primary care physician for further evaluation. Follow-up as needed.     ED Prescriptions     Medication Sig Dispense Auth. Provider   triamcinolone cream (KENALOG) 0.1 % Apply 1 Application topically 2 (two) times daily. 30 g Darly Fails-Warren, Sadie Haber, NP      PDMP not reviewed this encounter.   Abran Cantor, NP 08/08/23 1331

## 2023-08-08 NOTE — ED Triage Notes (Signed)
2 tick bites to back.  Wife pulled ticks off yesterday.  Patient has red streaks come down flank on left side.  States areas itch.  Has been using hydrocortisone cream.

## 2023-08-08 NOTE — Discharge Instructions (Addendum)
Apply medication as prescribed. May take over-the-counter Zyrtec during the daytime and Benadryl at bedtime to help with itching. Avoid hot baths or showers while symptoms persist.  Recommend taking lukewarm baths. May apply cool cloths to the area to help with itching or discomfort. Avoid scratching, rubbing, or manipulating the areas while symptoms persist. If symptoms do not improve with this treatment, please follow-up with your primary care physician for further evaluation. Follow-up as needed.

## 2023-08-09 ENCOUNTER — Other Ambulatory Visit: Payer: Self-pay | Admitting: Gastroenterology

## 2023-08-17 ENCOUNTER — Encounter: Payer: Self-pay | Admitting: Physician Assistant

## 2023-08-17 ENCOUNTER — Ambulatory Visit (INDEPENDENT_AMBULATORY_CARE_PROVIDER_SITE_OTHER): Payer: Medicare Other | Admitting: Physician Assistant

## 2023-08-17 DIAGNOSIS — M25561 Pain in right knee: Secondary | ICD-10-CM | POA: Diagnosis not present

## 2023-08-17 DIAGNOSIS — Z9889 Other specified postprocedural states: Secondary | ICD-10-CM

## 2023-08-17 MED ORDER — METHYLPREDNISOLONE ACETATE 40 MG/ML IJ SUSP
40.0000 mg | INTRAMUSCULAR | Status: AC | PRN
Start: 2023-08-17 — End: 2023-08-17
  Administered 2023-08-17: 40 mg via INTRA_ARTICULAR

## 2023-08-17 MED ORDER — LIDOCAINE HCL 1 % IJ SOLN
2.0000 mL | INTRAMUSCULAR | Status: AC | PRN
Start: 2023-08-17 — End: 2023-08-17
  Administered 2023-08-17: 2 mL

## 2023-08-17 MED ORDER — BUPIVACAINE HCL 0.25 % IJ SOLN
2.0000 mL | INTRAMUSCULAR | Status: AC | PRN
Start: 2023-08-17 — End: 2023-08-17
  Administered 2023-08-17: 2 mL via INTRA_ARTICULAR

## 2023-08-17 NOTE — Progress Notes (Addendum)
Post-Op Visit Note   Patient: Marvin Bender           Date of Birth: 1956/08/08           MRN: [id number] Visit Date: 08/17/2023 PCP: System, Provider Not In   Assessment & Plan:  Chief Complaint:  Chief Complaint  Patient presents with   Right Knee - Follow-up    Right knee scope 06/17/2023   Visit Diagnoses:  1. S/P right knee arthroscopy     Plan: Patient is a pleasant 67 year old gentleman who comes in today approximately 9 weeks status post right knee arthroscopic debridement medial meniscus and subchondroplasty lateral femoral condyle, date of surgery 06/17/2023.  He has been doing much better but still continues to have some pain going from a seated to standing position as well as with pivoting.  He has been wearing a compression sleeve.  Examination of his right knee reveals a trace effusion.  Range of motion 0 to 120 degrees.  He is neurovascularly intact distally.  Today, we discussed that he is likely symptomatic from the underlying arthritis.  I have offered him a cortisone injection for which she would like to proceed.  He will follow-up with [place] as needed.  Procedure Note  Patient: Marvin Bender             Date of Birth: 16-Nov-1956           MRN: [id number]             Visit Date: 08/17/2023  Procedures: Visit Diagnoses:  1. S/P right knee arthroscopy     Large Joint Inj: R knee on 08/17/2023 10:26 AM Indications: pain Details: 22 G needle, anterolateral approach Medications: 2 mL lidocaine 1 %; 2 mL bupivacaine 0.25 %; 40 mg methylPREDNISolone acetate 40 MG/ML        Follow-Up Instructions: Return if symptoms worsen or fail to improve.   Orders:  Orders Placed This Encounter  Procedures   Large Joint Inj: R knee   Meds ordered this encounter  Medications   bupivacaine (MARCAINE) 0.25 % (with pres) injection 2 mL   lidocaine (XYLOCAINE) 1 % (with pres) injection 2 mL   methylPREDNISolone acetate (DEPO-MEDROL) injection 40 mg    Imaging: No new  imaging  PMFS History: Patient Active Problem List   Diagnosis Date Noted   Pain in right knee 05/18/2023   Hypokalemia    TIA (transient ischemic attack) 09/04/2021   Obesity (BMI 30-39.9) 05/20/2017   Multiple pulmonary nodules 09/30/2013   DOE (dyspnea on exertion) 01/09/2010   HYPERLIPIDEMIA 01/07/2010   ANXIETY 01/07/2010   Essential hypertension 01/07/2010   HEADACHE, CHRONIC 01/07/2010   Past Medical History:  Diagnosis Date   Anxiety    GERD (gastroesophageal reflux disease)    Hypercholesteremia    Hypertension    Hypokalemia    Pulmonary nodules    TIA (transient ischemic attack)    PT denies    Family History  Problem Relation Age of Onset   Asthma Mother    Heart disease Mother    Dementia Mother    Heart disease Sister    Arthritis Sister    Heart failure Brother    Diabetes Maternal Grandmother    Dementia Maternal Aunt    Dementia Maternal Uncle    Colon cancer Neg Hx    Stomach cancer Neg Hx    Esophageal cancer Neg Hx    Colon polyps Neg Hx     Past Surgical History:  Procedure Laterality Date   KNEE ARTHROSCOPY WITH MEDIAL MENISECTOMY Left 04/30/2015   Procedure: LEFT KNEE ARTHROSCOPY WITH PARTIAL MEDIAL MENISECTOMY;  Surgeon: Darreld Mclean, MD;  Location: [facility];  Service: Orthopedics;  Laterality: Left;   NO PAST SURGERIES     Social History   Occupational History   Occupation: Retired  Tobacco Use   Smoking status: Former    Current packs/day: 0.00    Average packs/day: 1 pack/day for 40.0 years (40.0 ttl pk-yrs)    Types: Cigarettes    Start date: 04/12/1967    Quit date: 04/12/2007    Years since quitting: 16.3   Smokeless tobacco: Never  Vaping Use   Vaping status: Never Used  Substance and Sexual Activity   Alcohol use: Not Currently    Comment: occasional beer    Drug use: No   Sexual activity: Never    Birth control/protection: None

## 2023-08-17 NOTE — Addendum Note (Signed)
Addended by: Cristie Hem on: 08/17/2023 10:28 AM   Modules accepted: Level of Service

## 2023-08-30 ENCOUNTER — Other Ambulatory Visit: Payer: Self-pay | Admitting: Gastroenterology

## 2023-10-14 IMAGING — RF DG ESOPHAGUS
10 of 11 series · 14 of 24 positions shown · non-contrast
Comparison: NONE.

CLINICAL DATA: Sensation of food getting stuck in the mid-chest,
history of reflux

EXAM:
ESOPHAGUS/BARIUM SWALLOW/TABLET STUDY
TECHNIQUE: Combined double and single contrast examination was performed using
effervescent crystals, high-density barium, and thin liquid barium.
This exam was performed by Reichling, Hauben, and was supervised
and interpreted by Marli, Inaas.
FLUOROSCOPY:
Radiation Exposure Index (as provided by the fluoroscopic device):
37.90 mGy

[Series 1: cp_standard · 2 of 108 frames shown (1 of 6)]
[frame 17/108]
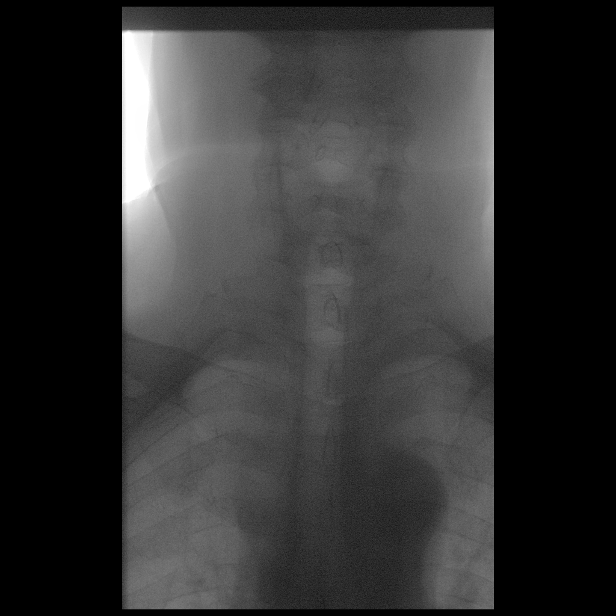
[frame 92/108]
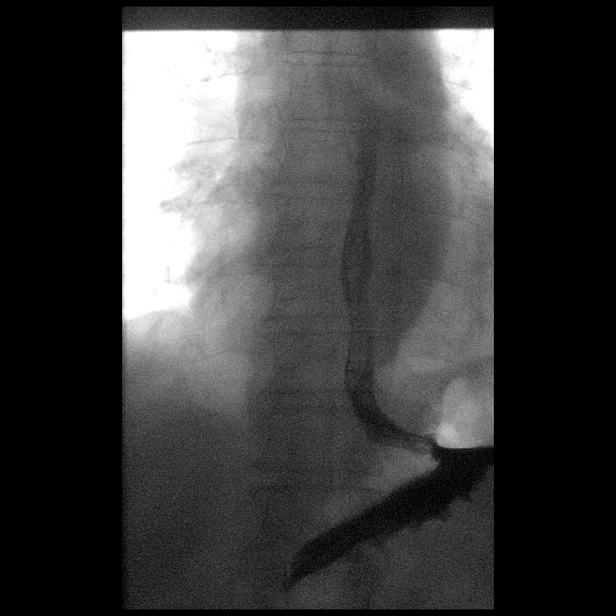

[Series 2: cp_standard · 1 of 48 frames shown (2 of 6)]
[frame 41/48]
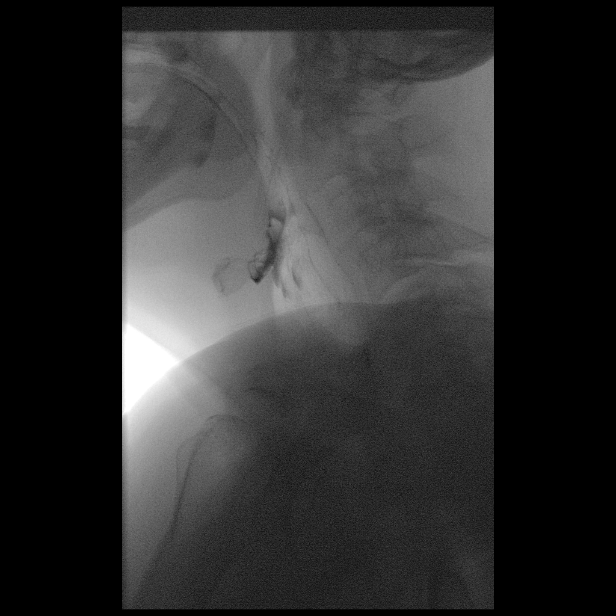

[Series 3: fluoro_barium 2fps_bw · 1 of 3 frames shown (1 of 4)]
[frame 3/3]
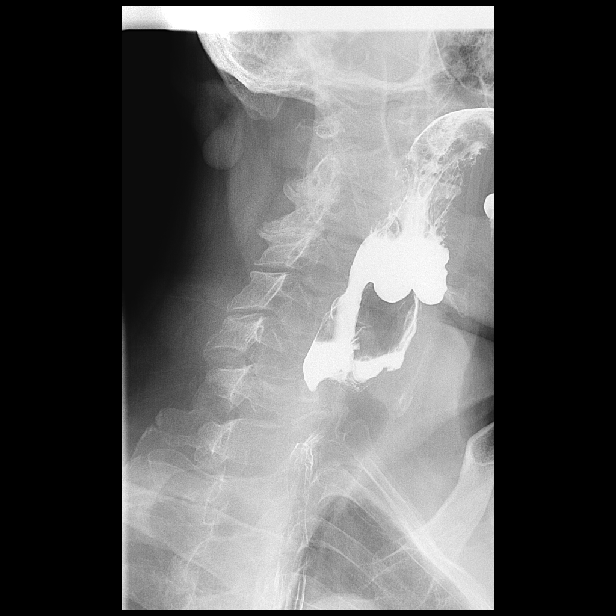

[Series 4: fluoro_barium 2fps_bw · 1 of 4 frames shown (2 of 4)]
[frame 2/4]
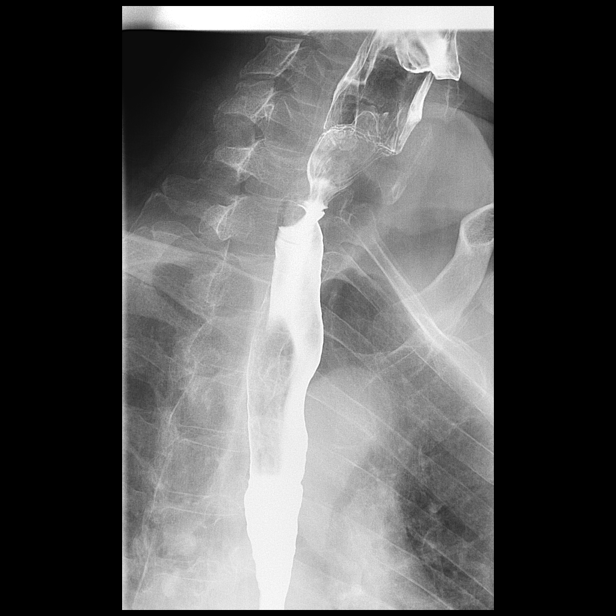

[Series 5: fluoro_barium 2fps_bw · 1 of 6 frames shown (3 of 4)]
[frame 1/6]
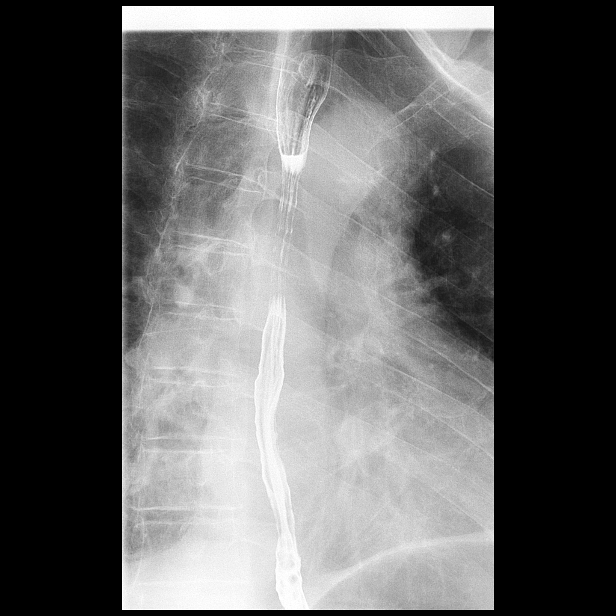

[Series 6: fluoro_barium 2fps_bw · 2 of 8 frames shown (4 of 4)]
[frame 2/8]
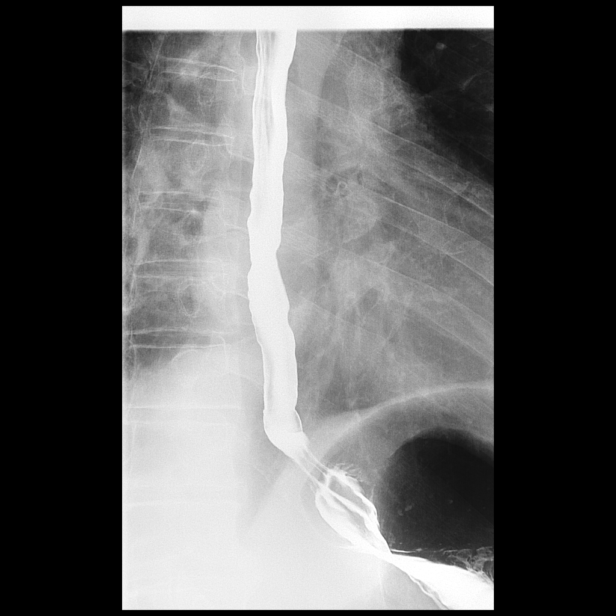
[frame 3/8]
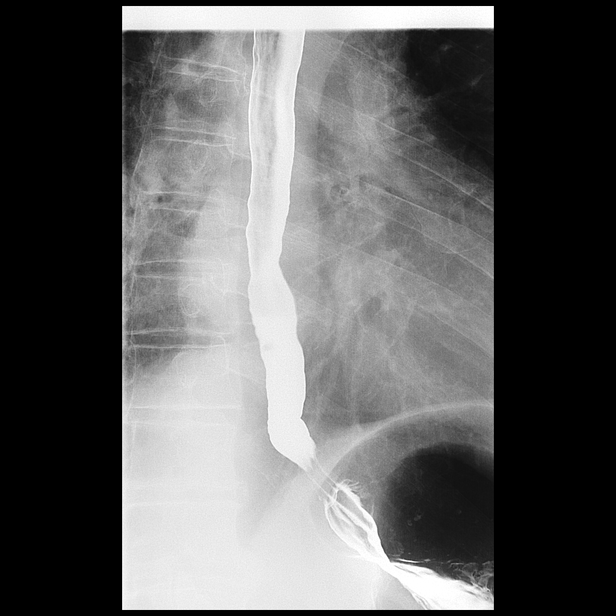

[Series 8: cp_standard · 1 of 1 slices shown (3 of 6)]
[im 1/1]
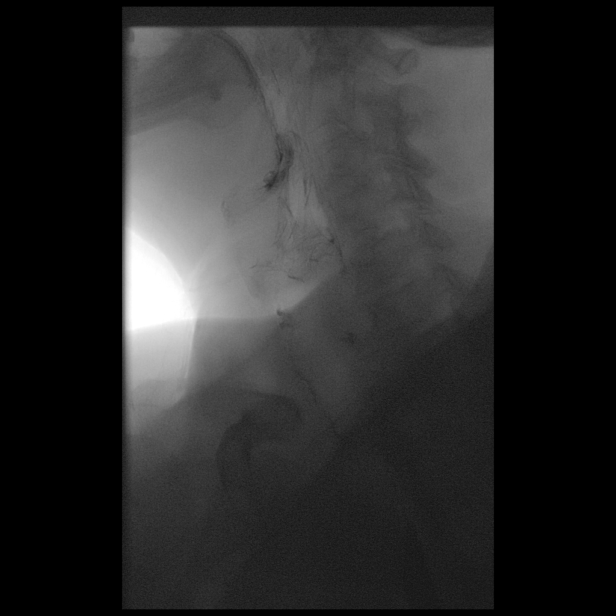

[Series 9: cp_standard · 1 of 260 frames shown (4 of 6)]
[frame 131/260]
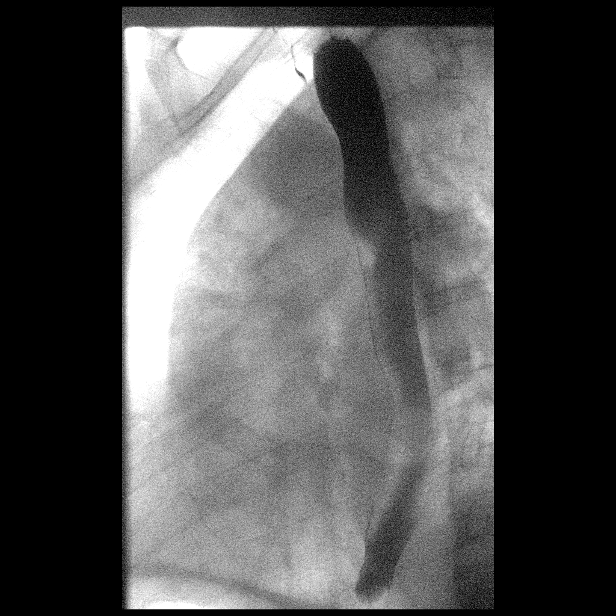

[Series 10: cp_standard · 2 of 37 frames shown (5 of 6)]
[frame 32/37]
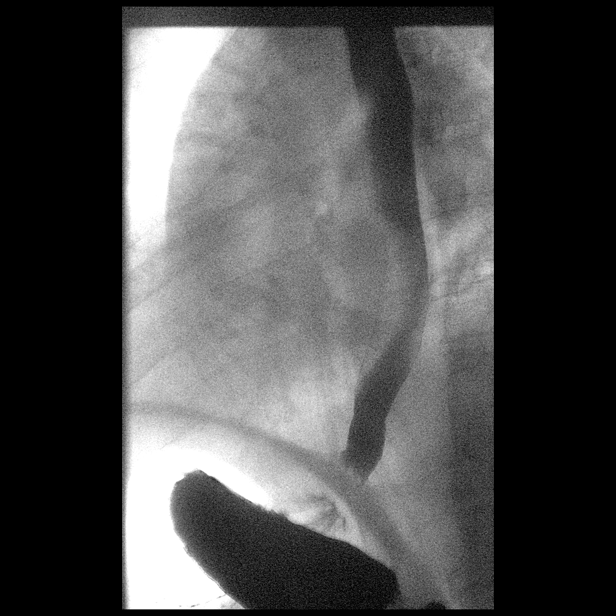
[frame 37/37]
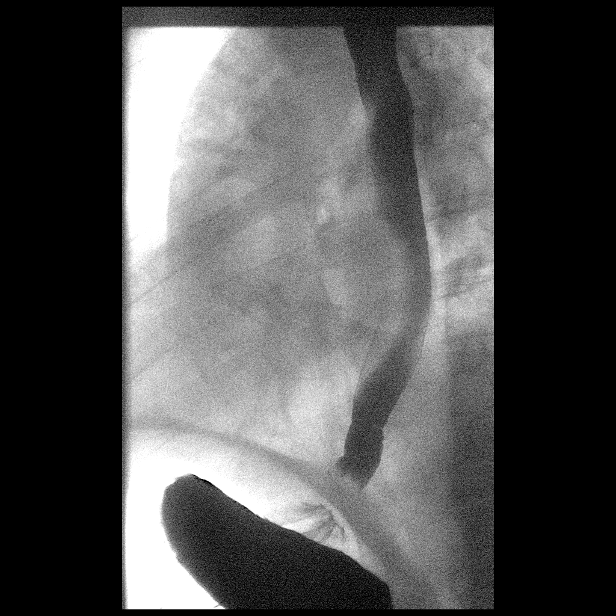

[Series 14: cp_standard · 2 of 93 frames shown (6 of 6)]
[frame 14/93]
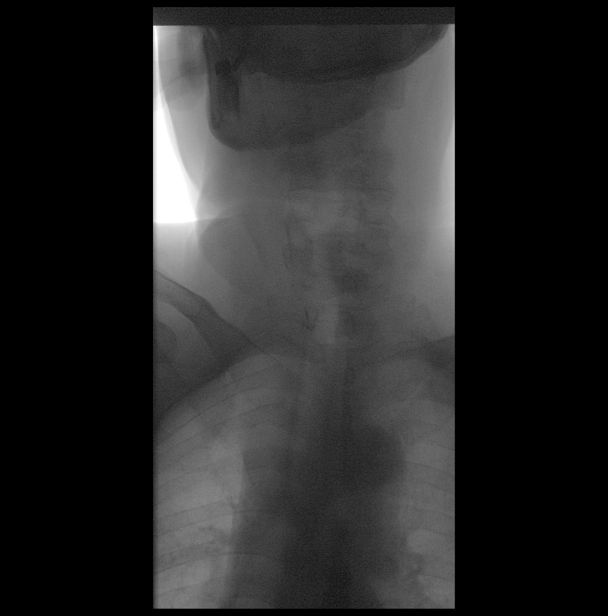
[frame 80/93]
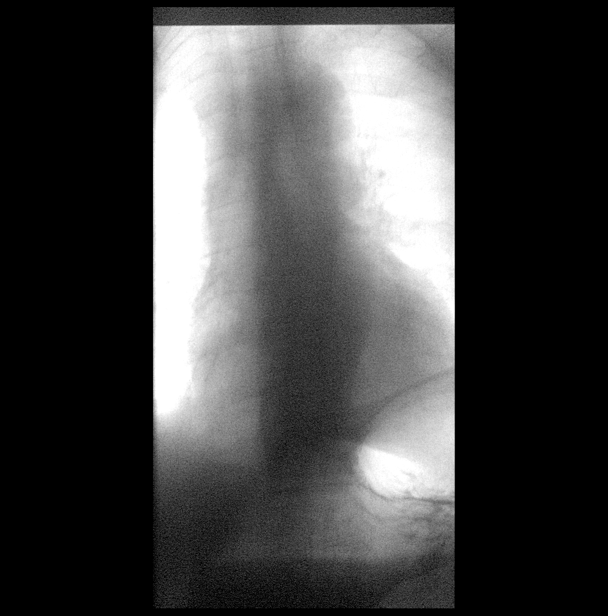

[14 of 24 positions shown; findings below may reference images not displayed]

FINDINGS: Swallowing: Silent tracheal aspiration occurs with contrast consumed
by sequential swallows with a straw. This is not successfully
cleared with prompted cough.

Pharynx: Unremarkable.

Esophagus: Unremarkable course, caliber, and mucosa.

Esophageal motility: Proximal escape waves and contrast stasis in
the upper esophagus.

Hiatal Hernia: Tiny, sliding type.

Gastroesophageal reflux: Visualized to the associated level of the
aortic knob

Ingested 13mm barium tablet: Passed normally

Other: None.
IMPRESSION: 1.  Esophageal dysmotility

2. Small volume, silent aspiration. Consider evaluation by speech
pathology.

3.  Tiny hiatal hernia and associated gastroesophageal reflux.

## 2024-03-08 NOTE — Progress Notes (Unsigned)
   Office Visit Note   Patient: Marvin Bender           Date of Birth: Apr 21, 1956           MRN: [id number] Visit Date: 03/09/2024              Requested by: No referring provider defined for this encounter. PCP: System, Provider Not In   Assessment & Plan: Visit Diagnoses: No diagnosis found.  Plan: ***  Follow-Up Instructions: No follow-ups on file.   Orders:  No orders of the defined types were placed in this encounter. No orders of the defined types were placed in this encounter.    Procedures: No procedures performed   Clinical Data: No additional findings.   Subjective: No chief complaint on file.  HPI  Review of Systems   Objective: Vital Signs: There were no vitals taken for this visit.  Physical Exam  Ortho Exam  Specialty Comments:  No specialty comments available.  Imaging: No results found.   PMFS History: Patient Active Problem List   Diagnosis Date Noted  . Pain in right knee 05/18/2023  . Hypokalemia   . TIA (transient ischemic attack) 09/04/2021  . Obesity (BMI 30-39.9) 05/20/2017  . Multiple pulmonary nodules 09/30/2013  . DOE (dyspnea on exertion) 01/09/2010  . HYPERLIPIDEMIA 01/07/2010  . Anxiety state 01/07/2010  . Essential hypertension 01/07/2010  . Headache 01/07/2010   Past Medical History:  Diagnosis Date  . Anxiety   . GERD (gastroesophageal reflux disease)   . Hypercholesteremia   . Hypertension   . Hypokalemia   . Pulmonary nodules   . TIA (transient ischemic attack)    PT denies    Family History  Problem Relation Age of Onset  . Asthma Mother   . Heart disease Mother   . Dementia Mother   . Heart disease Sister   . Arthritis Sister   . Heart failure Brother   . Diabetes Maternal Grandmother   . Dementia Maternal Aunt   . Dementia Maternal Uncle   . Colon cancer Neg Hx   . Stomach cancer Neg Hx   . Esophageal cancer Neg Hx   . Colon polyps Neg Hx     Past Surgical History:  Procedure Laterality  Date  . KNEE ARTHROSCOPY WITH MEDIAL MENISECTOMY Left 04/30/2015   Procedure: LEFT KNEE ARTHROSCOPY WITH PARTIAL MEDIAL MENISECTOMY;  Surgeon: Darreld Mclean, MD;  Location: [facility];  Service: Orthopedics;  Laterality: Left;  . NO PAST SURGERIES     Social History   Occupational History  . Occupation: Retired  Tobacco Use  . Smoking status: Former    Current packs/day: 0.00    Average packs/day: 1 pack/day for 40.0 years (40.0 ttl pk-yrs)    Types: Cigarettes    Start date: 04/12/1967    Quit date: 04/12/2007    Years since quitting: 16.9  . Smokeless tobacco: Never  Vaping Use  . Vaping status: Never Used  Substance and Sexual Activity  . Alcohol use: Not Currently    Comment: occasional beer   . Drug use: No  . Sexual activity: Never    Birth control/protection: None

## 2024-03-09 ENCOUNTER — Ambulatory Visit (INDEPENDENT_AMBULATORY_CARE_PROVIDER_SITE_OTHER): Admitting: Orthopaedic Surgery

## 2024-03-09 ENCOUNTER — Other Ambulatory Visit (INDEPENDENT_AMBULATORY_CARE_PROVIDER_SITE_OTHER): Payer: Self-pay

## 2024-03-09 DIAGNOSIS — M545 Low back pain, unspecified: Secondary | ICD-10-CM

## 2024-03-10 ENCOUNTER — Telehealth: Payer: Self-pay

## 2024-03-10 MED ORDER — HYDROCODONE-ACETAMINOPHEN 5-325 MG PO TABS: 1.0000 | ORAL_TABLET | Freq: Every day | ORAL | 0 refills | Status: AC | PRN

## 2024-03-10 NOTE — Telephone Encounter (Signed)
I sent norco

## 2024-03-10 NOTE — Addendum Note (Signed)
 Addended by: Mayra Reel on: 03/10/2024 11:56 AM   Modules accepted: Orders

## 2024-03-10 NOTE — Telephone Encounter (Signed)
 Called and notified patient.

## 2024-03-10 NOTE — Telephone Encounter (Signed)
 Patient called concerning a Rx being sent to his pharmacy.  Please send to [facility] in [place].  CB# [phone].  Please advise.  Thank you.

## 2024-04-05 ENCOUNTER — Encounter: Payer: Self-pay | Admitting: Physical Therapy

## 2024-04-05 ENCOUNTER — Other Ambulatory Visit: Payer: Self-pay

## 2024-04-05 ENCOUNTER — Ambulatory Visit: Admitting: Physical Therapy

## 2024-04-05 DIAGNOSIS — M62830 Muscle spasm of back: Secondary | ICD-10-CM | POA: Diagnosis present

## 2024-04-05 DIAGNOSIS — M5459 Other low back pain: Secondary | ICD-10-CM | POA: Diagnosis present

## 2024-04-05 NOTE — Therapy (Unsigned)
 OUTPATIENT PHYSICAL THERAPY THORACOLUMBAR EVALUATION   Patient Name: Marvin Bender MRN: [id number] DOB:09/29/1956, 68 y.o., male Today's Date: 04/05/2024  END OF SESSION:  PT End of Session - 04/05/24 0948     Visit Number 1    Number of Visits 12    Date for PT Re-Evaluation 05/17/24    PT Start Time 0925    PT Stop Time 1008    PT Time Calculation (min) 43 min    Activity Tolerance Patient tolerated treatment well    Behavior During Therapy WFL for tasks assessed/performed             Past Medical History:  Diagnosis Date   Anxiety    GERD (gastroesophageal reflux disease)    Hypercholesteremia    Hypertension    Hypokalemia    Pulmonary nodules    TIA (transient ischemic attack)    PT denies   Past Surgical History:  Procedure Laterality Date   KNEE ARTHROSCOPY WITH MEDIAL MENISECTOMY Left 04/30/2015   Procedure: LEFT KNEE ARTHROSCOPY WITH PARTIAL MEDIAL MENISECTOMY;  Surgeon: Pleasant Brilliant, MD;  Location: [facility];  Service: Orthopedics;  Laterality: Left;   NO PAST SURGERIES     Patient Active Problem List   Diagnosis Date Noted   Pain in right knee 05/18/2023   Hypokalemia    TIA (transient ischemic attack) 09/04/2021   Obesity (BMI 30-39.9) 05/20/2017   Multiple pulmonary nodules 09/30/2013   DOE (dyspnea on exertion) 01/09/2010   HYPERLIPIDEMIA 01/07/2010   Anxiety state 01/07/2010   Essential hypertension 01/07/2010   Headache 01/07/2010    REFERRING PROVIDER: Burnell Carry MD  REFERRING DIAG: Lumbar spondylosis   Rationale for Evaluation and Treatment: Rehabilitation  THERAPY DIAG:  Other low back pain  Muscle spasm of back  ONSET DATE: ~ a month ago.    SUBJECTIVE:                                                                                                                                                                                           SUBJECTIVE STATEMENT: The patient presents to the clinic with c/o low back pain for  about a month and and half.  The pain came on while seated and twisting.  He states that initially he was in severe pain and could not straighten up for about a week.  His pain is rated at a 4/10 and increases with increased activity and walking.  Rest decreases his pain.    PERTINENT HISTORY:  See above.  H/o right knee pain.    PAIN:  Are you having pain? Yes: NPRS scale: 4/10. Pain location: LB Pain  description: Throb Aggravating factors: See above. Relieving factors: See above.  PRECAUTIONS: None  RED FLAGS: None   WEIGHT BEARING RESTRICTIONS: No  FALLS:  Has patient fallen in last 6 months? No  LIVING ENVIRONMENT: Lives with: lives with their spouse Lives in: House/apartment Has following equipment at home: None  PLOF: Independent  PATIENT GOALS: Not have pain.      OBJECTIVE:  Note: Objective measures were completed at Evaluation unless otherwise noted.  DIAGNOSTIC FINDINGS:  X-rays of the lumbar spine show diffuse degenerative changes. No acute  abnormalities.    PATIENT SURVEYS:  Modified Oswestry 22/50.   POSTURE: rounded shoulders, forward head, decreased lumbar lordosis, and flexed trunk   PALPATION: TTP right of L4-5 and associated erector spinae musculature and SIJ.    LUMBAR ROM:   Full active lumbar flexion and extension to 15 degrees and painful.      LOWER EXTREMITY MMT:    No notable strength deficits.    LUMBAR SPECIAL TESTS:  Equal leg lengths.  Minimal pain increase with right SLR.    GAIT: The patient is walking in some trunk flexion and he has a back brace donned.    TREATMENT DATE: 04/05/24:  HMP and IFC at 80-150 Hz on 40% scan x 17 minutes to patient's low back.  Patient enjoyed treatment.  Normal modality response following removal of modality.                                                                                                                                PATIENT EDUCATION:  Education details:  Person  educated:  International aid/development worker:  Education comprehension:   HOME EXERCISE PROGRAM:   ASSESSMENT:  CLINICAL IMPRESSION: The patient presents to [facility] with c/o of low back pain that has been ongoing for about 6 weeks after twisting while seated.  He has pain reproduction with extension and minimal pain increase with a right SLR.  Prolonged walking increases his pain.  His Modified Owestry score is 22/50.  He is TTP right of L4-5 associated erector spinae musculatureand SIJ. He stands in a flexed trunk posture and is using a back brace.  Patient will benefit from skilled physical therapy intervention to address pain and deficits.    OBJECTIVE IMPAIRMENTS: decreased activity tolerance, decreased ROM, increased muscle spasms, postural dysfunction, and pain.   ACTIVITY LIMITATIONS: carrying, lifting, bending, standing, and locomotion level  PARTICIPATION LIMITATIONS: meal prep, cleaning, laundry, and yard work  [facility] POTENTIAL: Good  CLINICAL DECISION MAKING: Stable/uncomplicated  EVALUATION COMPLEXITY: Low   GOALS: LONG TERM GOALS: Target date: 05/17/24  Ind with a HEP. Goal status: INITIAL  2.  Walk a community distance with pain not > 2-3/10.  Goal status: INITIAL  3.  Perform ADL's with pain not > 2-3/10.  Goal status: INITIAL PLAN:  PT FREQUENCY: 2x/week  PT DURATION: 6 weeks  PLANNED INTERVENTIONS: 97110-Therapeutic exercises, 97530- Therapeutic activity, [id number]- Neuromuscular re-education, 97535- Self Care,  40981- Manual therapy, G0283- Electrical stimulation (unattended), [id number]- Ultrasound, Patient/Family education, Cryotherapy, and Moist heat.  PLAN FOR NEXT SESSION: Combo e'stim/US  at 1.50 W/CM2, STW/M, core exercise progression, spinal protection techniques and body mechanics training.    Marvin Bender, [place], PT 04/05/2024, 10:29 AM

## 2024-04-10 ENCOUNTER — Ambulatory Visit

## 2024-04-10 DIAGNOSIS — M5459 Other low back pain: Secondary | ICD-10-CM | POA: Diagnosis not present

## 2024-04-10 DIAGNOSIS — M62830 Muscle spasm of back: Secondary | ICD-10-CM

## 2024-04-10 NOTE — Therapy (Signed)
 OUTPATIENT PHYSICAL THERAPY THORACOLUMBAR TREATMENT    Patient Name: Marvin Bender MRN: [id number] DOB:08-Jan-1956, 68 y.o., male Today's Date: 04/10/2024  END OF SESSION:  PT End of Session - 04/10/24 0934     Visit Number 2    Number of Visits 12    Date for PT Re-Evaluation 05/17/24    PT Start Time 0930    PT Stop Time 1025    PT Time Calculation (min) 55 min    Activity Tolerance Patient limited by pain    Behavior During Therapy [facility] for tasks assessed/performed             Past Medical History:  Diagnosis Date   Anxiety    GERD (gastroesophageal reflux disease)    Hypercholesteremia    Hypertension    Hypokalemia    Pulmonary nodules    TIA (transient ischemic attack)    PT denies   Past Surgical History:  Procedure Laterality Date   KNEE ARTHROSCOPY WITH MEDIAL MENISECTOMY Left 04/30/2015   Procedure: LEFT KNEE ARTHROSCOPY WITH PARTIAL MEDIAL MENISECTOMY;  Surgeon: Pleasant Brilliant, MD;  Location: [facility];  Service: Orthopedics;  Laterality: Left;   NO PAST SURGERIES     Patient Active Problem List   Diagnosis Date Noted   Pain in right knee 05/18/2023   Hypokalemia    TIA (transient ischemic attack) 09/04/2021   Obesity (BMI 30-39.9) 05/20/2017   Multiple pulmonary nodules 09/30/2013   DOE (dyspnea on exertion) 01/09/2010   HYPERLIPIDEMIA 01/07/2010   Anxiety state 01/07/2010   Essential hypertension 01/07/2010   Headache 01/07/2010    REFERRING PROVIDER: Burnell Carry MD  REFERRING DIAG: Lumbar spondylosis   Rationale for Evaluation and Treatment: Rehabilitation  THERAPY DIAG:  Other low back pain  Muscle spasm of back  ONSET DATE: ~ a month ago.    SUBJECTIVE:                                                                                                                                                                                           SUBJECTIVE STATEMENT:  The patient reports low back has been hurting since his initial  evaluation. He stated playing golf and doing yard work could be contributing to increased pain. Leaning back has been the most notable movement that is giving him issues. Pain was 3-4/10 at start of treatment.   PERTINENT HISTORY:  See above.  H/o right knee pain.    PAIN:  Are you having pain? Yes: NPRS scale: 3-4/10. Pain location: LB Pain description: Throb Aggravating factors: See above. Relieving factors: See above.  PRECAUTIONS: None  RED FLAGS: None  WEIGHT BEARING RESTRICTIONS: No  FALLS:  Has patient fallen in last 6 months? No  LIVING ENVIRONMENT: Lives with: lives with their spouse Lives in: House/apartment Has following equipment at home: None  PLOF: Independent  PATIENT GOALS: Not have pain.    OBJECTIVE:  Note: Objective measures were completed at Evaluation unless otherwise noted.  DIAGNOSTIC FINDINGS:  X-rays of the lumbar spine show diffuse degenerative changes. No acute  abnormalities.   PATIENT SURVEYS:  Modified Oswestry 22/50.   POSTURE: rounded shoulders, forward head, decreased lumbar lordosis, and flexed trunk   PALPATION: TTP right of L4-5 and associated erector spinae musculature and SIJ.    LUMBAR ROM:   Full active lumbar flexion and extension to 15 degrees and painful.    LOWER EXTREMITY MMT:    No notable strength deficits.    LUMBAR SPECIAL TESTS:  Equal leg lengths.  Minimal pain increase with right SLR.    GAIT: The patient is walking in some trunk flexion and he has a back brace donned.    TREATMENT:                                    04/10/24 EXERCISE LOG   Exercise Repetitions and Resistance Comments  UBE   10 minutes 120 RPM   Shoulder Rolls   3 sets of 12 reps  CW and CW TA  SH horizontal abd 3 sets of 15 reps yellow bands    Open books  2 sets of 12 reps   Both sides   Abdominal Bracing   3 sets of 12 reps    Windshield Wipers   4 minutes     Blank cell = exercise not performed today   Modalities    Date:  Unattended Estim: Lumbar, IFC at 80-150 Hz on 40% scan, 15 mins, Pain Hot Pack: Lumbar, 15 mins, Pain  Upon completion of treatment, patient reported decreased pain and no adverse effects.   04/05/24:  HMP and IFC at 80-150 Hz on 40% scan x 17 minutes to patient's low back.  Patient enjoyed treatment.  Normal modality response following removal of modality.                                                                                                                                PATIENT EDUCATION:  Education details:  Person educated:  International aid/development worker:  Education comprehension:   HOME EXERCISE PROGRAM:   ASSESSMENT:  CLINICAL IMPRESSION:   The patient's treatment was modified due to low back pain. He was provided verbal and tactile cueing with abdominal bracing, shoulder rolls, horizontal abduction, windshield wiper and open books exercises to ensure proper muscle engagement and body mechanics. He attempted to rush through the majority exercises and had to be cued to slow down and avoid compensatory pattern. He was provided with e-stim and hot pack modalities which  decreased pain and increased mobility. He was also provided Biofreeze on his lower back at the end of treatment session. He reported 1/10 pain upon completion of treatment. He reported continued adherence to prior HEP for LBP since last episode of care; education provided to reinforce use. Patient will benefit from skilled PT in order address impairments and decrease pain while improving ability to perform ADL's.      OBJECTIVE IMPAIRMENTS: decreased activity tolerance, decreased ROM, increased muscle spasms, postural dysfunction, and pain.   ACTIVITY LIMITATIONS: carrying, lifting, bending, standing, and locomotion level  PARTICIPATION LIMITATIONS: meal prep, cleaning, laundry, and yard work  [facility] POTENTIAL: Good  CLINICAL DECISION MAKING: Stable/uncomplicated  EVALUATION COMPLEXITY: Low   GOALS: LONG  TERM GOALS: Target date: 05/17/24  Ind with a HEP. Goal status: INITIAL  2.  Walk a community distance with pain not > 2-3/10.  Goal status: INITIAL  3.  Perform ADL's with pain not > 2-3/10.  Goal status: INITIAL PLAN:  PT FREQUENCY: 2x/week  PT DURATION: 6 weeks  PLANNED INTERVENTIONS: 97110-Therapeutic exercises, 97530- Therapeutic activity, [id number]- Neuromuscular re-education, 97535- Self Care, 69629- Manual therapy, G0283- Electrical stimulation (unattended), 97035- Ultrasound, Patient/Family education, Cryotherapy, and Moist heat.  PLAN FOR NEXT SESSION: Continue core exercises, postural exercises, back stabilization and strengthening exercises, STW/M, modalities as needed.     Caia Lofaro, Student-PT 04/10/2024, 11:52 AM

## 2024-04-17 ENCOUNTER — Encounter

## 2024-10-23 ENCOUNTER — Encounter: Payer: Self-pay | Admitting: Radiology
